# Patient Record
Sex: Female | Born: 1985 | Race: Black or African American | Hispanic: No | Marital: Single | Smoking: Current some day smoker
Health system: Southern US, Community
[De-identification: ages and names within clinical notes are randomized; demographics above are authoritative.]

## PROBLEM LIST (undated history)

## (undated) ENCOUNTER — Inpatient Hospital Stay (HOSPITAL_COMMUNITY): Payer: Self-pay

## (undated) DIAGNOSIS — M419 Scoliosis, unspecified: Secondary | ICD-10-CM

## (undated) HISTORY — PX: NO PAST SURGERIES: SHX2092

---

## 2010-01-20 ENCOUNTER — Inpatient Hospital Stay (HOSPITAL_COMMUNITY)
Admission: AD | Admit: 2010-01-20 | Discharge: 2010-01-21 | Payer: Self-pay | Source: Home / Self Care | Attending: Obstetrics & Gynecology | Admitting: Obstetrics & Gynecology

## 2010-01-28 LAB — URINALYSIS, ROUTINE W REFLEX MICROSCOPIC
Ketones, ur: 15 mg/dL — AB
Leukocytes, UA: NEGATIVE
Nitrite: NEGATIVE
Protein, ur: 30 mg/dL — AB
Specific Gravity, Urine: >1.03 — ABNORMAL HIGH (ref 1.005–1.030)
Urine Glucose, Fasting: NEGATIVE mg/dL
Urobilinogen, UA: 1 mg/dL (ref 0.0–1.0)
pH: 5.5 (ref 5.0–8.0)

## 2010-01-28 LAB — WET PREP, GENITAL
Trich, Wet Prep: NONE SEEN
Yeast Wet Prep HPF POC: NONE SEEN

## 2010-01-28 LAB — POCT PREGNANCY, URINE: Preg Test, Ur: NEGATIVE

## 2010-01-28 LAB — URINE MICROSCOPIC-ADD ON

## 2010-01-28 LAB — COMPREHENSIVE METABOLIC PANEL
ALT: 24 U/L (ref 0–35)
AST: 31 U/L (ref 0–37)
Albumin: 3.2 g/dL — ABNORMAL LOW (ref 3.5–5.2)
Alkaline Phosphatase: 42 U/L (ref 39–117)
BUN: 9 mg/dL (ref 6–23)
CO2: 24 mEq/L (ref 19–32)
Calcium: 8.5 mg/dL (ref 8.4–10.5)
Chloride: 104 mEq/L (ref 96–112)
Creatinine, Ser: 0.83 mg/dL (ref 0.4–1.2)
GFR calc Af Amer: >60 mL/min (ref >60)
GFR calc non Af Amer: >60 mL/min (ref >60)
Glucose, Bld: 159 mg/dL — ABNORMAL HIGH (ref 70–99)
Potassium: 3.2 mEq/L — ABNORMAL LOW (ref 3.5–5.1)
Sodium: 135 mEq/L (ref 135–145)
Total Bilirubin: 2.1 mg/dL — ABNORMAL HIGH (ref 0.3–1.2)
Total Protein: 6.5 g/dL (ref 6.0–8.3)

## 2010-01-28 LAB — CBC
HCT: 33.5 % — ABNORMAL LOW (ref 36.0–46.0)
Hemoglobin: 11.4 g/dL — ABNORMAL LOW (ref 12.0–15.0)
MCH: 32.6 pg (ref 26.0–34.0)
MCHC: 34 g/dL (ref 30.0–36.0)
MCV: 95.7 fL (ref 78.0–100.0)
Platelets: 226 10*3/uL (ref 150–400)
RBC: 3.5 MIL/uL — ABNORMAL LOW (ref 3.87–5.11)
RDW: 13.3 % (ref 11.5–15.5)
WBC: 20.8 10*3/uL — ABNORMAL HIGH (ref 4.0–10.5)

## 2010-01-28 LAB — GC/CHLAMYDIA PROBE AMP, GENITAL
Chlamydia, DNA Probe: NEGATIVE
GC Probe Amp, Genital: POSITIVE — AB

## 2010-01-28 LAB — HIV ANTIBODY (ROUTINE TESTING W REFLEX): HIV: NONREACTIVE

## 2010-01-28 LAB — RPR: RPR Ser Ql: NONREACTIVE

## 2010-02-13 ENCOUNTER — Ambulatory Visit: Admit: 2010-02-13 | Payer: Self-pay | Admitting: Obstetrics & Gynecology

## 2010-02-13 ENCOUNTER — Encounter: Payer: Self-pay | Admitting: Family Medicine

## 2010-09-13 ENCOUNTER — Other Ambulatory Visit (HOSPITAL_COMMUNITY): Payer: Self-pay

## 2010-09-13 ENCOUNTER — Emergency Department (HOSPITAL_COMMUNITY): Payer: Self-pay

## 2010-09-13 ENCOUNTER — Emergency Department (HOSPITAL_COMMUNITY)
Admission: EM | Admit: 2010-09-13 | Discharge: 2010-09-13 | Disposition: A | Discharge status: Home / Self Care | Payer: Self-pay | Attending: Emergency Medicine | Admitting: Emergency Medicine

## 2010-09-13 DIAGNOSIS — R112 Nausea with vomiting, unspecified: Secondary | ICD-10-CM | POA: Insufficient documentation

## 2010-09-13 DIAGNOSIS — J45909 Unspecified asthma, uncomplicated: Secondary | ICD-10-CM | POA: Insufficient documentation

## 2010-09-13 DIAGNOSIS — O2390 Unspecified genitourinary tract infection in pregnancy, unspecified trimester: Secondary | ICD-10-CM | POA: Insufficient documentation

## 2010-09-13 DIAGNOSIS — N949 Unspecified condition associated with female genital organs and menstrual cycle: Secondary | ICD-10-CM | POA: Insufficient documentation

## 2010-09-13 DIAGNOSIS — N739 Female pelvic inflammatory disease, unspecified: Secondary | ICD-10-CM | POA: Insufficient documentation

## 2010-09-13 LAB — POCT I-STAT, CHEM 8
BUN: 11 mg/dL (ref 6–23)
Calcium, Ion: 1.13 mmol/L (ref 1.12–1.32)
Chloride: 106 mEq/L (ref 96–112)
Creatinine, Ser: 0.6 mg/dL (ref 0.50–1.10)
Glucose, Bld: 74 mg/dL (ref 70–99)
HCT: 42 % (ref 36.0–46.0)
Hemoglobin: 14.3 g/dL (ref 12.0–15.0)
Potassium: 3.7 mEq/L (ref 3.5–5.1)
Sodium: 135 mEq/L (ref 135–145)
TCO2: 22 mmol/L (ref 0–100)

## 2010-09-13 LAB — URINALYSIS, ROUTINE W REFLEX MICROSCOPIC
Bilirubin Urine: NEGATIVE
Glucose, UA: NEGATIVE mg/dL
Hgb urine dipstick: NEGATIVE
Ketones, ur: 15 mg/dL — AB
Leukocytes, UA: NEGATIVE
Nitrite: NEGATIVE
Protein, ur: NEGATIVE mg/dL
Specific Gravity, Urine: 1.023 (ref 1.005–1.030)
Urobilinogen, UA: 1 mg/dL (ref 0.0–1.0)
pH: 6.5 (ref 5.0–8.0)

## 2010-09-13 LAB — HCG, QUANTITATIVE, PREGNANCY: hCG, Beta Chain, Quant, S: 24330 m[IU]/mL — ABNORMAL HIGH (ref <5)

## 2010-09-13 LAB — ABO/RH: ABO/RH(D): A POS

## 2010-09-13 LAB — POCT PREGNANCY, URINE: Preg Test, Ur: POSITIVE

## 2010-09-14 LAB — GC/CHLAMYDIA PROBE AMP, GENITAL
Chlamydia, DNA Probe: NEGATIVE
GC Probe Amp, Genital: NEGATIVE

## 2010-12-29 ENCOUNTER — Inpatient Hospital Stay (HOSPITAL_COMMUNITY)
Admission: AD | Admit: 2010-12-29 | Discharge: 2010-12-29 | Disposition: A | Discharge status: Home / Self Care | Payer: Self-pay | Source: Ambulatory Visit | Attending: Family Medicine | Admitting: Family Medicine

## 2010-12-29 ENCOUNTER — Inpatient Hospital Stay (HOSPITAL_COMMUNITY): Payer: Self-pay

## 2010-12-29 ENCOUNTER — Encounter (HOSPITAL_COMMUNITY): Payer: Self-pay | Admitting: *Deleted

## 2010-12-29 DIAGNOSIS — O0930 Supervision of pregnancy with insufficient antenatal care, unspecified trimester: Secondary | ICD-10-CM | POA: Insufficient documentation

## 2010-12-29 DIAGNOSIS — R109 Unspecified abdominal pain: Secondary | ICD-10-CM | POA: Insufficient documentation

## 2010-12-29 DIAGNOSIS — O99891 Other specified diseases and conditions complicating pregnancy: Secondary | ICD-10-CM | POA: Insufficient documentation

## 2010-12-29 HISTORY — DX: Scoliosis, unspecified: M41.9

## 2010-12-29 LAB — URINALYSIS, ROUTINE W REFLEX MICROSCOPIC
Bilirubin Urine: NEGATIVE
Glucose, UA: NEGATIVE mg/dL
Hgb urine dipstick: NEGATIVE
Ketones, ur: NEGATIVE mg/dL
Nitrite: NEGATIVE
Protein, ur: NEGATIVE mg/dL
Specific Gravity, Urine: 1.015 (ref 1.005–1.030)
Urobilinogen, UA: 0.2 mg/dL (ref 0.0–1.0)
pH: 6 (ref 5.0–8.0)

## 2010-12-29 LAB — URINE MICROSCOPIC-ADD ON

## 2010-12-29 LAB — CBC
HCT: 32.5 % — ABNORMAL LOW (ref 36.0–46.0)
MCHC: 33.8 g/dL (ref 30.0–36.0)
RDW: 13.1 % (ref 11.5–15.5)

## 2010-12-29 LAB — DIFFERENTIAL
Basophils Absolute: 0 10*3/uL (ref 0.0–0.1)
Basophils Relative: 0 % (ref 0–1)
Eosinophils Relative: 0 % (ref 0–5)
Monocytes Absolute: 0.8 10*3/uL (ref 0.1–1.0)
Neutro Abs: 6.4 10*3/uL (ref 1.7–7.7)

## 2010-12-29 LAB — RAPID URINE DRUG SCREEN, HOSP PERFORMED
Amphetamines: NOT DETECTED
Benzodiazepines: NOT DETECTED
Cocaine: NOT DETECTED
Opiates: NOT DETECTED

## 2010-12-29 LAB — WET PREP, GENITAL

## 2010-12-29 LAB — RPR: RPR Ser Ql: NONREACTIVE

## 2010-12-29 MED ORDER — PRENATAL RX 60-1 MG PO TABS: 1.0000 | ORAL_TABLET | Freq: Every day | ORAL | Status: DC

## 2010-12-29 NOTE — ED Provider Notes (Signed)
History     Chief Complaint  Patient presents with  . Abdominal Pain   HPI Comments: 25 yo [id number] at [redacted]w[redacted]d who presents with stomach pain.  She states that the pain started 2-3 days ago.  They occur "off and on" q30-40 minutes.   Denies loss of fluid, no vaginal bleeding.   No vaginal itching or increased discharge.    No PNC, waiting on medicaid.  Would like a proof of pregnancy letter.   Intermittent HA, relieved by tylenol and rest.     Denies family hx of genetic d/o.   G1 - born "2 weeks early", approximately 6lbs G2 - born "7 weeks early" - 5l;bs, spent 2 weeks in NICU b/c of breathing problems.  -previously deliveries and prenatal care in [place].   No history of HTN or DM.   Denies EtOH use.  Quit smoking 3 months ago.  Occasional marajuana use.   Past Medical History  Diagnosis Date  . Asthma   . Scoliosis     Past Surgical History  Procedure Date  . No past surgeries     Family History  Problem Relation Age of Onset  . Diabetes Maternal Aunt     History  Substance Use Topics  . Smoking status: Former Smoker    Quit date: 08/29/2010  . Smokeless tobacco: Not on file  . Alcohol Use: No    Allergies: No Known Allergies  Prescriptions prior to admission  Medication Sig Dispense Refill  . acetaminophen (TYLENOL) 325 MG tablet Take 650 mg by mouth every 6 (six) hours as needed. pain         Review of Systems  Constitutional: Negative for fever and chills.  HENT: Negative for congestion.   Eyes: Negative for blurred vision.  Respiratory: Negative for cough.   Cardiovascular: Negative for chest pain and palpitations.  Gastrointestinal: Positive for abdominal pain. Negative for heartburn, nausea and vomiting.  Genitourinary: Negative for dysuria, urgency and frequency.  Skin: Negative for rash.  Neurological: Positive for headaches. Negative for dizziness.  Psychiatric/Behavioral: Negative for depression.   Physical Exam   Blood pressure  118/62, pulse 91, temperature 98.4 F (36.9 C), temperature source Oral, resp. rate 18, height 5\' 7"  (1.702 m), weight 71.305 kg (157 lb 3.2 oz).  Physical Exam  Constitutional: She is oriented to person, place, and time. She appears well-developed and well-nourished.  HENT:  Head: Normocephalic and atraumatic.  Eyes: EOM are normal.  Neck: Normal range of motion.  Cardiovascular: Normal rate and regular rhythm.  Exam reveals no gallop and no friction rub.   No murmur heard. Respiratory: Effort normal and breath sounds normal. No respiratory distress. She has no wheezes.  GI: Soft. She exhibits no distension.       Gravid  Genitourinary: Vagina normal. There is no rash, tenderness or lesion on the right labia. There is no rash, tenderness or lesion on the left labia. Cervix exhibits discharge.  Musculoskeletal: She exhibits no edema.  Neurological: She is alert and oriented to person, place, and time.  Skin: Skin is warm and dry.  Psychiatric: She has a normal mood and affect.    MAU Course  Procedures -[place] 14+ week  Assessment and Plan  25yo [id number] at [redacted]w[redacted]d who presents with lower abdominal cramping.  -No PNC -Feeling baby move -in process of applying for medicaid -otherwise healthy, but with history of LBW babies and preterm delivery, with baby spending 2 weeks in NICU -ROI sent for records in [place] -pt  will f/u in low risk clinic  Discussed with Deirdre Boris Lown, Skylin Kennerson 12/29/2010, 6:19 PM

## 2010-12-29 NOTE — Progress Notes (Signed)
Pt reports having lower abd pain and pressure on and off for 2-3 days. No prenatalcare started. Hx of PT delivery 33weeks

## 2010-12-30 ENCOUNTER — Encounter: Payer: Self-pay | Admitting: Obstetrics & Gynecology

## 2010-12-30 LAB — GC/CHLAMYDIA PROBE AMP, GENITAL: Chlamydia, DNA Probe: NEGATIVE

## 2011-01-08 ENCOUNTER — Encounter: Payer: Self-pay | Admitting: Family

## 2011-01-08 ENCOUNTER — Ambulatory Visit (INDEPENDENT_AMBULATORY_CARE_PROVIDER_SITE_OTHER): Payer: Self-pay | Admitting: Family

## 2011-01-08 VITALS — BP 104/68 | Temp 98.5°F | Wt 153.6 lb

## 2011-01-08 DIAGNOSIS — O09219 Supervision of pregnancy with history of pre-term labor, unspecified trimester: Secondary | ICD-10-CM | POA: Insufficient documentation

## 2011-01-08 DIAGNOSIS — Z23 Encounter for immunization: Secondary | ICD-10-CM

## 2011-01-08 DIAGNOSIS — Z8751 Personal history of pre-term labor: Secondary | ICD-10-CM

## 2011-01-08 DIAGNOSIS — O0990 Supervision of high risk pregnancy, unspecified, unspecified trimester: Secondary | ICD-10-CM

## 2011-01-08 LAB — POCT URINALYSIS DIP (DEVICE)
Bilirubin Urine: NEGATIVE
Glucose, UA: NEGATIVE mg/dL
Hgb urine dipstick: NEGATIVE
Ketones, ur: NEGATIVE mg/dL
Specific Gravity, Urine: >=1.03 (ref 1.005–1.030)

## 2011-01-08 MED ORDER — TETANUS-DIPHTH-ACELL PERTUSSIS 5-2.5-18.5 LF-MCG/0.5 IM SUSP: 0.5000 mL | Freq: Once | INTRAMUSCULAR | Status: DC

## 2011-01-08 MED ORDER — INFLUENZA VIRUS VACC SPLIT PF IM SUSP: 0.5000 mL | INTRAMUSCULAR | Status: AC

## 2011-01-08 NOTE — Progress Notes (Signed)
Pain/pressure- side of back.  Pulse-76

## 2011-01-08 NOTE — Progress Notes (Signed)
Pt here, referred from MAU as a new ob; OB labs completed in [facility], as well as an ultrasound;  Reports irregular contractions, cervix 1 cm, same as in MAU.  1 hr GCT and pap completed. Preterm labor precautions given.      Exam    Uterine Size: size equals dates  Pelvic Exam:    Perineum: No Hemorrhoids, Normal Perineum   Vulva: normal   Vagina:  normal mucosa, normal discharge, no palpable nodules   pH: Not done   Cervix: no bleeding following Pap, no cervical motion tenderness and no lesions   Adnexa: normal adnexa and no mass, fullness, tenderness   Bony Pelvis: Adequate  System: Breast:  No nipple retraction or dimpling, No nipple discharge or bleeding, No axillary or supraclavicular adenopathy, Normal to palpation without dominant masses   Skin: normal coloration and turgor, no rashes    Neurologic: negative   Extremities: normal strength, tone, and muscle mass   HEENT neck supple with midline trachea and thyroid without masses   Mouth/Teeth mucous membranes moist, pharynx normal without lesions   Neck supple and no masses   Cardiovascular: regular rate and rhythm, no murmurs or gallops   Respiratory:  appears well, vitals normal, no respiratory distress, acyanotic, normal RR, neck free of mass or lymphadenopathy, chest clear, no wheezing, crepitations, rhonchi, normal symmetric air entry   Abdomen: soft, non-tender; bowel sounds normal; no masses,  no organomegaly   Urinary: urethral meatus normal

## 2011-01-10 LAB — CULTURE, OB URINE: Colony Count: 2000

## 2011-01-14 NOTE — L&D Delivery Note (Cosign Needed)
Delivery Note At 7:34 AM a viable female was delivered via Vaginal, Spontaneous Delivery (Presentation: Right Occiput Anterior).  APGAR: 8, 8; weight .   Placenta status: Intact, Spontaneous Pathology.  Cord: 3 vessels with the following complications: None.  Cord pH: 7.29 normal range for preterm baby.    Anesthesia: Epidural  Episiotomy: None Lacerations: None Suture Repair: N/A Est. Blood Loss (mL): 250  Mom to postpartum.  Baby to NICU.   Candace Booker. MD PGY-1  01/21/2011, 8:47 AM

## 2011-01-15 ENCOUNTER — Ambulatory Visit (INDEPENDENT_AMBULATORY_CARE_PROVIDER_SITE_OTHER): Payer: Self-pay | Admitting: Family

## 2011-01-15 ENCOUNTER — Other Ambulatory Visit: Payer: Self-pay | Admitting: Family

## 2011-01-15 ENCOUNTER — Encounter: Payer: Self-pay | Admitting: *Deleted

## 2011-01-15 DIAGNOSIS — O09219 Supervision of pregnancy with history of pre-term labor, unspecified trimester: Secondary | ICD-10-CM

## 2011-01-15 DIAGNOSIS — R87619 Unspecified abnormal cytological findings in specimens from cervix uteri: Secondary | ICD-10-CM

## 2011-01-15 LAB — POCT URINALYSIS DIP (DEVICE)
Bilirubin Urine: NEGATIVE
Ketones, ur: NEGATIVE mg/dL

## 2011-01-15 NOTE — Progress Notes (Signed)
Pelvic pressure. No vaginal discharge. Pulse 77. Pt has 4 lb weight loss since last visit.

## 2011-01-15 NOTE — Progress Notes (Signed)
Reviewed pap smear results (LSIL w possible HGIL) need colpo after delivery;  4 lb weight loss since last visit; if continued loss at next visit plan to see nutritional ist.

## 2011-01-20 ENCOUNTER — Inpatient Hospital Stay (HOSPITAL_COMMUNITY)
Admission: AD | Admit: 2011-01-20 | Discharge: 2011-01-23 | DRG: 775 | Disposition: A | Discharge status: Home / Self Care | Payer: Medicaid Other | Source: Ambulatory Visit | Attending: Obstetrics & Gynecology | Admitting: Obstetrics & Gynecology

## 2011-01-20 ENCOUNTER — Encounter (HOSPITAL_COMMUNITY): Payer: Self-pay | Admitting: *Deleted

## 2011-01-20 ENCOUNTER — Inpatient Hospital Stay (HOSPITAL_COMMUNITY): Payer: Medicaid Other | Admitting: Anesthesiology

## 2011-01-20 ENCOUNTER — Encounter (HOSPITAL_COMMUNITY): Payer: Self-pay | Admitting: Anesthesiology

## 2011-01-20 DIAGNOSIS — O09219 Supervision of pregnancy with history of pre-term labor, unspecified trimester: Secondary | ICD-10-CM

## 2011-01-20 DIAGNOSIS — Z8751 Personal history of pre-term labor: Secondary | ICD-10-CM

## 2011-01-20 DIAGNOSIS — R87619 Unspecified abnormal cytological findings in specimens from cervix uteri: Secondary | ICD-10-CM

## 2011-01-20 LAB — CBC
HCT: 32.9 % — ABNORMAL LOW (ref 36.0–46.0)
Hemoglobin: 11.2 g/dL — ABNORMAL LOW (ref 12.0–15.0)
MCV: 96.5 fL (ref 78.0–100.0)
RDW: 13.1 % (ref 11.5–15.5)
WBC: 9.6 10*3/uL (ref 4.0–10.5)

## 2011-01-20 LAB — URINALYSIS, ROUTINE W REFLEX MICROSCOPIC
Glucose, UA: NEGATIVE mg/dL
Ketones, ur: NEGATIVE mg/dL
Protein, ur: NEGATIVE mg/dL
pH: 6.5 (ref 5.0–8.0)

## 2011-01-20 LAB — CULTURE, BETA STREP (GROUP B ONLY)

## 2011-01-20 LAB — WET PREP, GENITAL: Yeast Wet Prep HPF POC: NONE SEEN

## 2011-01-20 LAB — URINE MICROSCOPIC-ADD ON

## 2011-01-20 MED ORDER — FENTANYL 2.5 MCG/ML BUPIVACAINE 1/10 % EPIDURAL INFUSION (WH - ANES)
14.0000 mL/h | INTRAMUSCULAR | Status: DC
  Administered 2011-01-20 – 2011-01-21 (×2): 14 mL/h via EPIDURAL
  Filled 2011-01-20 (×2): qty 60

## 2011-01-20 MED ORDER — LACTATED RINGERS IV SOLN
INTRAVENOUS | Status: DC
  Administered 2011-01-20: 100 mL/h via INTRAVENOUS
  Administered 2011-01-20 – 2011-01-21 (×2): via INTRAVENOUS

## 2011-01-20 MED ORDER — PENICILLIN G POTASSIUM 5000000 UNITS IJ SOLR
5.0000 10*6.[IU] | Freq: Once | INTRAVENOUS | Status: AC
  Administered 2011-01-20: 5 10*6.[IU] via INTRAVENOUS
  Filled 2011-01-20: qty 5

## 2011-01-20 MED ORDER — MAGNESIUM SULFATE BOLUS VIA INFUSION
4.0000 g | Freq: Once | INTRAVENOUS | Status: DC
  Administered 2011-01-20: 4 g via INTRAVENOUS
  Filled 2011-01-20: qty 500

## 2011-01-20 MED ORDER — DIPHENHYDRAMINE HCL 50 MG/ML IJ SOLN: 12.5000 mg | INTRAMUSCULAR | Status: DC | PRN

## 2011-01-20 MED ORDER — PHENYLEPHRINE 40 MCG/ML (10ML) SYRINGE FOR IV PUSH (FOR BLOOD PRESSURE SUPPORT): 80.0000 ug | PREFILLED_SYRINGE | INTRAVENOUS | Status: DC | PRN

## 2011-01-20 MED ORDER — BETAMETHASONE SOD PHOS & ACET 6 (3-3) MG/ML IJ SUSP
12.0000 mg | Freq: Once | INTRAMUSCULAR | Status: DC
  Filled 2011-01-20: qty 2

## 2011-01-20 MED ORDER — OXYTOCIN 20 UNITS IN LACTATED RINGERS INFUSION - SIMPLE
125.0000 mL/h | Freq: Once | INTRAVENOUS | Status: AC
  Administered 2011-01-21: 150 mL/h via INTRAVENOUS

## 2011-01-20 MED ORDER — PHENYLEPHRINE 40 MCG/ML (10ML) SYRINGE FOR IV PUSH (FOR BLOOD PRESSURE SUPPORT)
80.0000 ug | PREFILLED_SYRINGE | INTRAVENOUS | Status: DC | PRN
  Filled 2011-01-20: qty 5

## 2011-01-20 MED ORDER — PENICILLIN G POTASSIUM 5000000 UNITS IJ SOLR
2.5000 10*6.[IU] | INTRAVENOUS | Status: DC
  Administered 2011-01-20 – 2011-01-21 (×4): 2.5 10*6.[IU] via INTRAVENOUS
  Filled 2011-01-20 (×7): qty 2.5

## 2011-01-20 MED ORDER — MAGNESIUM SULFATE 40 G IN LACTATED RINGERS - SIMPLE
2.0000 g/h | INTRAVENOUS | Status: DC
  Administered 2011-01-20: 2 g/h via INTRAVENOUS
  Filled 2011-01-20: qty 500

## 2011-01-20 MED ORDER — LACTATED RINGERS IV SOLN: 500.0000 mL | INTRAVENOUS | Status: DC | PRN

## 2011-01-20 MED ORDER — OXYTOCIN BOLUS FROM INFUSION
500.0000 mL | Freq: Once | INTRAVENOUS | Status: DC
  Filled 2011-01-20: qty 1000
  Filled 2011-01-20: qty 500

## 2011-01-20 MED ORDER — LACTATED RINGERS IV SOLN
500.0000 mL | Freq: Once | INTRAVENOUS | Status: AC
  Administered 2011-01-20: 500 mL via INTRAVENOUS

## 2011-01-20 MED ORDER — OXYCODONE-ACETAMINOPHEN 5-325 MG PO TABS: 2.0000 | ORAL_TABLET | ORAL | Status: DC | PRN

## 2011-01-20 MED ORDER — NALBUPHINE SYRINGE 5 MG/0.5 ML
5.0000 mg | INJECTION | INTRAMUSCULAR | Status: DC | PRN
  Administered 2011-01-20 (×2): 5 mg via INTRAVENOUS
  Filled 2011-01-20 (×2): qty 0.5

## 2011-01-20 MED ORDER — FLEET ENEMA 7-19 GM/118ML RE ENEM: 1.0000 | ENEMA | RECTAL | Status: DC | PRN

## 2011-01-20 MED ORDER — ONDANSETRON HCL 4 MG/2ML IJ SOLN: 4.0000 mg | Freq: Four times a day (QID) | INTRAMUSCULAR | Status: DC | PRN

## 2011-01-20 MED ORDER — EPHEDRINE 5 MG/ML INJ: 10.0000 mg | INTRAVENOUS | Status: DC | PRN

## 2011-01-20 MED ORDER — LIDOCAINE HCL 1.5 % IJ SOLN
INTRAMUSCULAR | Status: DC | PRN
  Administered 2011-01-20: 4 mL via INTRADERMAL
  Administered 2011-01-20: 3 mL via INTRADERMAL
  Administered 2011-01-20: 4 mL via INTRADERMAL

## 2011-01-20 MED ORDER — CITRIC ACID-SODIUM CITRATE 334-500 MG/5ML PO SOLN: 30.0000 mL | ORAL | Status: DC | PRN

## 2011-01-20 MED ORDER — ACETAMINOPHEN 325 MG PO TABS: 650.0000 mg | ORAL_TABLET | ORAL | Status: DC | PRN

## 2011-01-20 MED ORDER — LIDOCAINE HCL (PF) 1 % IJ SOLN
30.0000 mL | INTRAMUSCULAR | Status: DC | PRN
  Filled 2011-01-20: qty 30

## 2011-01-20 MED ORDER — IBUPROFEN 600 MG PO TABS
600.0000 mg | ORAL_TABLET | Freq: Four times a day (QID) | ORAL | Status: DC | PRN
  Administered 2011-01-21: 600 mg via ORAL
  Filled 2011-01-20: qty 1

## 2011-01-20 MED ORDER — BETAMETHASONE SOD PHOS & ACET 6 (3-3) MG/ML IJ SUSP
12.0000 mg | INTRAMUSCULAR | Status: DC
  Administered 2011-01-20: 12 mg via INTRAMUSCULAR
  Filled 2011-01-20 (×2): qty 2

## 2011-01-20 MED ORDER — EPHEDRINE 5 MG/ML INJ
10.0000 mg | INTRAVENOUS | Status: DC | PRN
  Filled 2011-01-20: qty 4

## 2011-01-20 NOTE — H&P (Signed)
Candace Booker is a 26 y.o. female presenting for painful contractions and vaginal bleeding. The vaginal bleeding started yesterday and it was bright red, and was not associated to pain. Today she began with painful contractions. Pt has history of late prenatal care. She also has a history of her prior baby born at 64 weeks and both of her children being born with low weight. Afebrile. Has prior U/S in 12/30/2010 with placenta anterior and above cervical OS. Also has pap smear  LSIL with possible HGIL that needs colposcopy after delivery. Maternal Medical History:  Reason for admission: Reason for Admission:   nausea  OB History    Grav Para Term Preterm Abortions TAB SAB Ect Mult Living   3 2 1 1  0 0 0 0 0 2     Past Medical History  Diagnosis Date  . Asthma   . Scoliosis    Past Surgical History  Procedure Date  . No past surgeries    Family History: family history includes Diabetes in her maternal aunt. Social History:  reports that she quit smoking about 4 months ago. She does not have any smokeless tobacco history on file. She reports that she uses illicit drugs (Marijuana). She reports that she does not drink alcohol.  Review of Systems  Constitutional: Positive for weight loss. Negative for fever and chills.       4 lb weight loss reported since lat time she was seen in office.  Eyes: Negative for blurred vision and double vision.  Respiratory: Negative.   Cardiovascular: Negative for chest pain.  Gastrointestinal: Negative for nausea and vomiting.  Genitourinary: Negative.   Musculoskeletal: Positive for back pain.  Skin: Negative.   Neurological: Negative.  Negative for headaches.  Endo/Heme/Allergies: Negative.     Dilation: 4 Effacement (%): 90 Station: -1 Exam by:: Candace Booker Blood pressure 109/62, pulse 74, temperature 98.8 F (37.1 C), temperature source Oral, resp. rate 20, height 5\' 6"  (1.676 m), weight 68.493 kg (151 lb). Exam Physical Exam    Constitutional: She appears distressed.       Very distressed with painful contractions.  HENT:  Mouth/Throat: Oropharynx is clear and moist.  Eyes: Conjunctivae are normal.  Neck: Neck supple.  Cardiovascular: Normal rate, regular rhythm and normal heart sounds.   Respiratory: Effort normal and breath sounds normal. No respiratory distress.  GI: Soft. There is no rebound and no guarding.       Normal abdominal exam of a 33 week pregnancy.  Genitourinary:       Speculum and vaginal exam: Bleeding from cervical changes. No bleeding from OS. Cervix with 4-5 cm and 90 % effacement. Station -1. Intact membranes.   Musculoskeletal: She exhibits no edema.    Prenatal labs: ABO, Rh: --/--/A POS (12/16 1725) Antibody: NEG (12/16 1716) Rubella: 103.2 (12/16 1713) RPR: NON REACTIVE (12/16 1713)  HBsAg: NEGATIVE (12/16 1713)  HIV: NON REACTIVE (12/16 1713)  GBS:   unknown.  GTT 1h: 95   Assessment/Plan: Assessment: 1. Labor:Preterm. 2. Fetal Wellbeing: Category 1  3. Pain Control: may have epidural 4. GBS: unknown. 5. 33.3 week IUP  Plan:  1. Admit to BS. 2. Routine L&D orders 3. Magnesium Sulfate bolus and scheduled 4. Penicillin  5. Betamethasone x 2 doses. 6. Analgesia/anesthesia PRN  7. GC, Chlamydia, wet prep performed in MAU.    Candace Booker. MD PGY-1  01/20/2011, 1:36 PM

## 2011-01-20 NOTE — H&P (Signed)
Attestation of Attending Supervision of Resident: Evaluation and management procedures were performed by the [facility] Resident under my supervision.  I have reviewed the resident's note, chart reviewed and agree with management and plan.  Jaynie Collins, M.D. 01/20/2011 2:38 PM

## 2011-01-20 NOTE — Anesthesia Procedure Notes (Signed)
Epidural Patient location during procedure: OB Start time: 01/20/2011 11:02 PM Reason for block: procedure for pain  Staffing Performed by: anesthesiologist   Preanesthetic Checklist Completed: patient identified, site marked, surgical consent, pre-op evaluation, timeout performed, IV checked, risks and benefits discussed and monitors and equipment checked  Epidural Patient position: sitting Prep: site prepped and draped and DuraPrep Patient monitoring: continuous pulse ox and blood pressure Approach: midline Injection technique: LOR air  Needle:  Needle type: Tuohy  Needle gauge: 17 G Needle length: 9 cm Catheter type: closed end flexible Catheter size: 19 Gauge Test dose: negative  Assessment Events: blood not aspirated, injection not painful, no injection resistance, negative IV test and no paresthesia  Additional Notes Discussed risk of headache, infection, bleeding, nerve injury and failed or incomplete block.  Patient voices understanding and wishes to proceed.

## 2011-01-20 NOTE — Progress Notes (Signed)
Pain started yesterday.  Gotten closer today.  Bleeding yesterday and today.  Hx of PTL/ delivery.

## 2011-01-20 NOTE — Progress Notes (Signed)
Pt in c/o ucs q5 minutes, started yesterday, worse today.  Reports small amount of light vaginal bleeding.  Denies any other discharge or leaking of fluid.  +FM.

## 2011-01-20 NOTE — Consult Note (Signed)
Asked by Dr Adrian Blackwater to speak to Candace Booker regarding preterm outcome. She is 33 3/[redacted] wks pregnant in labor, late [facility], prenatal labs are neg with unknown GBS. She has had a 4 lb weight loss in the past few weeks. Per Candace Booker, she has been vomitting. FUS in Dec prior to weight loss shows appropriate fetal growth. She is on magnesium sulfate, Pen G,  and received a dose of betamethasone. She delivered a 29 weeker previously who is now 25 y.o. so she is familiar with the experience in the NICU. I discussed resuscitation, RDS, various respiratory support, and criteria for discharge from the hospital. She is not interested in breastfeeding. Thank you for this consult.  Emad Brechtel Q

## 2011-01-20 NOTE — Progress Notes (Signed)
Subjective: Contractions spaced out.  Still feel moderate pain with contraction  Objective: BP 112/65  Pulse 76  Temp(Src) 97.4 F (36.3 C) (Oral)  Resp 18  Ht 5\' 6"  (1.676 m)  Wt 68.493 kg (151 lb)  BMI 24.37 kg/m2   Total I/O In: -  Out: 100 [Urine:100]  FHT:  FHR: 125 bpm, variability: moderate,  accelerations:  Present,  decelerations:  Absent UC:   regular, every 8 minutes SVE:   Dilation: 6 Effacement (%): 90 Station: -1 Exam by:: Manus Rudd, MD  Labs: Lab Results  Component Value Date   WBC 9.6 01/20/2011   HGB 11.2* 01/20/2011   HCT 32.9* 01/20/2011   MCV 96.5 01/20/2011   PLT 230 01/20/2011    Assessment / Plan: spontaneous labor - on magnesium, pcn.  BMZ x 1 dose given.  Will continue tocolytics in attempt to arrest labor.  Oswaldo Cueto JEHIEL 01/20/2011, 3:46 PM

## 2011-01-20 NOTE — Anesthesia Preprocedure Evaluation (Addendum)
Anesthesia Evaluation  Patient identified by MRN, date of birth, ID band Patient awake    Reviewed: Allergy & Precautions, H&P , NPO status , Patient's Chart, lab work & pertinent test results, reviewed documented beta blocker date and time   History of Anesthesia Complications Negative for: history of anesthetic complications  Airway Mallampati: III TM Distance: >3 FB Neck ROM: full    Dental  (+) Teeth Intact   Pulmonary asthma (no inhaler use in years) , former smoker (quit 12/12) clear to auscultation        Cardiovascular neg cardio ROS regular Normal    Neuro/Psych Negative Neurological ROS  Negative Psych ROS   GI/Hepatic negative GI ROS, Neg liver ROS,   Endo/Other  Negative Endocrine ROS  Renal/GU negative Renal ROS  Genitourinary negative   Musculoskeletal   Abdominal   Peds  Hematology negative hematology ROS (+)   Anesthesia Other Findings   Reproductive/Obstetrics (+) Pregnancy (no prenatal care)                          Anesthesia Physical Anesthesia Plan  ASA: II  Anesthesia Plan: Epidural   Post-op Pain Management:    Induction:   Airway Management Planned:   Additional Equipment:   Intra-op Plan:   Post-operative Plan:   Informed Consent: I have reviewed the patients History and Physical, chart, labs and discussed the procedure including the risks, benefits and alternatives for the proposed anesthesia with the patient or authorized representative who has indicated his/her understanding and acceptance.     Plan Discussed with:   Anesthesia Plan Comments:         Anesthesia Quick Evaluation

## 2011-01-21 ENCOUNTER — Encounter (HOSPITAL_COMMUNITY): Payer: Self-pay | Admitting: *Deleted

## 2011-01-21 ENCOUNTER — Other Ambulatory Visit: Payer: Self-pay | Admitting: Obstetrics & Gynecology

## 2011-01-21 MED ORDER — TETANUS-DIPHTH-ACELL PERTUSSIS 5-2.5-18.5 LF-MCG/0.5 IM SUSP
0.5000 mL | Freq: Once | INTRAMUSCULAR | Status: DC
  Filled 2011-01-21: qty 0.5

## 2011-01-21 MED ORDER — ONDANSETRON HCL 4 MG/2ML IJ SOLN: 4.0000 mg | INTRAMUSCULAR | Status: DC | PRN

## 2011-01-21 MED ORDER — BENZOCAINE-MENTHOL 20-0.5 % EX AERO: 1.0000 "application " | INHALATION_SPRAY | CUTANEOUS | Status: DC | PRN

## 2011-01-21 MED ORDER — SIMETHICONE 80 MG PO CHEW: 80.0000 mg | CHEWABLE_TABLET | ORAL | Status: DC | PRN

## 2011-01-21 MED ORDER — WITCH HAZEL-GLYCERIN EX PADS: 1.0000 "application " | MEDICATED_PAD | CUTANEOUS | Status: DC | PRN

## 2011-01-21 MED ORDER — OXYCODONE-ACETAMINOPHEN 5-325 MG PO TABS
1.0000 | ORAL_TABLET | ORAL | Status: DC | PRN
  Administered 2011-01-21 – 2011-01-23 (×2): 1 via ORAL
  Filled 2011-01-21 (×2): qty 1

## 2011-01-21 MED ORDER — DIPHENHYDRAMINE HCL 25 MG PO CAPS: 25.0000 mg | ORAL_CAPSULE | Freq: Four times a day (QID) | ORAL | Status: DC | PRN

## 2011-01-21 MED ORDER — IBUPROFEN 600 MG PO TABS
600.0000 mg | ORAL_TABLET | Freq: Four times a day (QID) | ORAL | Status: DC
  Administered 2011-01-21 – 2011-01-23 (×7): 600 mg via ORAL
  Filled 2011-01-21 (×8): qty 1

## 2011-01-21 MED ORDER — ONDANSETRON HCL 4 MG PO TABS: 4.0000 mg | ORAL_TABLET | ORAL | Status: DC | PRN

## 2011-01-21 MED ORDER — ZOLPIDEM TARTRATE 5 MG PO TABS: 5.0000 mg | ORAL_TABLET | Freq: Every evening | ORAL | Status: DC | PRN

## 2011-01-21 MED ORDER — DIBUCAINE 1 % RE OINT: 1.0000 "application " | TOPICAL_OINTMENT | RECTAL | Status: DC | PRN

## 2011-01-21 MED ORDER — LANOLIN HYDROUS EX OINT: TOPICAL_OINTMENT | CUTANEOUS | Status: DC | PRN

## 2011-01-21 MED ORDER — SENNOSIDES-DOCUSATE SODIUM 8.6-50 MG PO TABS
2.0000 | ORAL_TABLET | Freq: Every day | ORAL | Status: DC
  Administered 2011-01-21 – 2011-01-22 (×2): 2 via ORAL

## 2011-01-21 MED ORDER — PRENATAL MULTIVITAMIN CH
1.0000 | ORAL_TABLET | Freq: Every day | ORAL | Status: DC
  Administered 2011-01-22 – 2011-01-23 (×2): 1 via ORAL
  Filled 2011-01-21 (×2): qty 1

## 2011-01-21 NOTE — Progress Notes (Signed)
Comfortable with epidural  FHR stable and reactive  UCs every 2-3 minutes  Cervix 9+/c/-1/BBOW  Will continue to observe

## 2011-01-21 NOTE — Progress Notes (Signed)
Now comfortable with epidural. FHR 120s with accels to 140s. Good variability UCs every 2-6 minutes Cervix 8cm now per RN Cervix was 7cm / Complete/ 0 / vtx 2 hours ago (did not chart accurately before)  Anticipate SVD tonight probably. Has already had NICU consult

## 2011-01-21 NOTE — Progress Notes (Signed)
Patient ID: Candace Booker, female   DOB: 1985/01/18, 26 y.o.   MRN: [id number]  Comfortable with epidural.  FHR 120s with good accels and no decels. UCs every 6-8 minutes.  Cervix 8cm per RN.  Cervical exam deferred for now.   Will continue to observe.

## 2011-01-21 NOTE — Progress Notes (Signed)
UR Chart review completed.  

## 2011-01-21 NOTE — Progress Notes (Signed)
Candace Booker is a 26 y.o. [phone] at [redacted]w[redacted]d by ultrasound admitted for preterm labor  Subjective:   Objective: BP 113/64  Pulse 92  Temp(Src) 97.9 F (36.6 C) (Oral)  Resp 18  Ht 5\' 6"  (1.676 m)  Wt 68.493 kg (151 lb)  BMI 24.37 kg/m2  SpO2 99% I/O last 3 completed shifts: In: 2028.8 [P.O.:1080; I.V.:598.8; IV Piggyback:350] Out: 550 [Urine:550] Total I/O In: 835 [P.O.:360; I.V.:475] Out: 675 [Urine:675]  FHT:  FHR: 140 bpm, variability: moderate,  accelerations:  Present,  decelerations:  Absent UC:   irregular, every 3 minutes SVE:   Dilation: 8 Effacement (%): 100 Station: 0 Exam by:: Carpenter,RN  Labs: Lab Results  Component Value Date   WBC 9.6 01/20/2011   HGB 11.2* 01/20/2011   HCT 32.9* 01/20/2011   MCV 96.5 01/20/2011   PLT 230 01/20/2011    Assessment / Plan: Spontaneous labor, progressing normally Preterm labor, unresponsive to Magnesium Sulfate  Labor: Progressing normally and progressing despite Magnesium Sulfate Preeclampsia:  n/a Fetal Wellbeing:  Category I Pain Control:  Epidural, desires I/D:  n/a Anticipated MOD:  NSVD Per Dr Jolayne Panther, keep on Magnesium until second Betamethasone injection. Updated on Cervical change.  [facility] 01/21/2011, 12:01 AM

## 2011-01-22 NOTE — Progress Notes (Signed)
Post Partum Day 1 Subjective: no complaints, up ad lib, voiding and tolerating PO  Objective: Blood pressure 104/58, pulse 64, temperature 99.3 F (37.4 C), temperature source Oral, resp. rate 18, height 5\' 6"  (1.676 m), weight 68.493 kg (151 lb), SpO2 100.00%, unknown if currently breastfeeding.  Physical Exam:  General: alert, cooperative and no distress Lochia: appropriate Uterine Fundus: firm DVT Evaluation: No evidence of DVT seen on physical exam.   Basename 01/20/11 1335  HGB 11.2*  HCT 32.9*    Assessment/Plan: Plan for discharge tomorrow and Breastfeeding   LOS: 2 days   Dom Haverland JEHIEL 01/22/2011, 12:31 PM

## 2011-01-22 NOTE — Progress Notes (Signed)
PSYCHOSOCIAL ASSESSMENT ~ MATERNAL/CHILD Name: Candace Booker.                                                                              Age: 25 day  Referral Date: 01/22/11   Reason/Source: NICU support, NPNC, Hx of MJ  I. FAMILY/HOME ENVIRONMENT A. Child's Legal Guardian _x__Parent(s) ___Grandparent ___Foster parent ___DSS_________________ Name: Candace Booker                                  DOB: [birth year]          Age: 67  Address: [address] [place], [place] [address]  Name: Candace Sane Sr.                         DOB: //                     Age: 92  Address: FOB does not live with [name]  B. Other Household Members/Support Persons Name:                                         Relationship: [name]                   Name: Candace Booker     Relationship: sister              DOB [birth year]                   Name: Candace Booker             Relationship: brother           DOB [birth year]                   Name:                                         Relationship:                        DOB ___/___/___  C. Other Support: [name] reports that she has a good support system of family and friends.  She moved to [place] approximately a year ago from [place], [place].  She has cousins and sisters in [place], as well as her mother, whom she lives with.  The rest of her family is in [place].   II. PSYCHOSOCIAL DATA A. Information Source  _x_Patient Interview  __Family Interview           __Other___________  B. Event organiser __Employment: __Medicaid    [place]:                 __Private Insurance:                   __Self Pay  _x_Food Stamps   __WIC __[employer] First     __Public Housing     __Section 8    __Maternity Care Coordination/Child Service Coordination/Early Intervention  __School:                                                                         Grade:   __Other:   Candace Booker Cultural and Environment Information Cultural Issues Impacting Care: none known  III. STRENGTHS _x__Supportive family/friends ___Adequate Resources _x__Compliance with medical plan ___Home prepared for Child (including basic supplies) _x__Understanding of illness      _x__Other: [name] would like to take baby and her other two children to  IV. RISK FACTORS AND CURRENT PROBLEMS         ____No Problems Noted                                                                                                                                                                                                                                                Pt              Family         Substance Abuse                                                                  _x__              ___             Mental  Illness                                                                        ___              ___  Family/Relationship Issues                                      ___               ___             Abuse/Neglect/Domestic Violence                                         ___         ___  Financial Resources                                        ___              ___             Transportation                                                                        ___               ___  DSS Involvement                                                                   ___              ___  Adjustment to Illness                                                               ___              ___  Knowledge/Cognitive Deficit                                                   ___              ___  Compliance with Treatment                                                 ___              ___  Basic Needs (food, housing, etc.)                                          ___              ___             Housing Concerns                                       ___               ___ Other_____________________________________________________________            V. SOCIAL WORK ASSESSMENT: SW met with [name] in her third floor room to introduce myself, complete assessment and evaluate how she is coping with baby's admission to NICU.  [name] was sleeping when SW knocked on the door, but she said SW could come in and talk with her.  She was very pleasant and seemed open with SW.  She states that she and FOB are "in between," when SW asked her about their relationship status.  She states that this is the third child for both of them, but first child together.  She states that he has been involved and supportive.  She has a 25 year old girl and 25 year old boy and he has two boys, ages 20 and 16.  [name] lives with her mother and two children.  FOB does not live in the home.  [name] states that she is from [place], [place] and moved here approximately one year ago.  She states that her son was born prematurely at [facility] and transferred to [place], [place] for NICU care.  She thinks that this baby's premature birth and NICU admission will be easier to cope with since she has had this experience before.  SW asked her about [facility] and she states that she found out she was pregnant at 3 months, but had a hard time getting Medicaid and could not be seen until it had been approved.  She admitted to the Marijuana use when asked and reports that it was an occasional occurrence and that she thinks the last time she used was mid December.  She states that she mostly used when she was feeling physically ill due to the pregnancy.  SW explained hospital drug screen policy and informed [name] that baby's UDS was negative.  She was very understanding.  SW asked which pediatrician [name] takes her children to and she said that she has not taken them anywhere in [place] yet.  SW suggests she gets them in to the pediatrician she wants to take her baby to.  She agreed and upon further discussion, states she would like to take  her children to [facility] on [facility].  SW will notify Candace Booker/Neonatal follow up coordinator.  SW asked if [name] has Sales executive and WIC and she states she  has Sales executive but does not have WIC.  SW told her to call WIC until she gets someone on the phone in order to enroll in the program.  She states that she has tried to pump, but is not sure she wants to continue.  SW told her about the benefits of breast milk for the baby, as long as she isn't smoking Marijuana, and [name] thinks she will keep trying.  [name] states that she does not have baby supplies since baby arrived early.  She still plans to have a baby shower and states that her plan was for FOB to get a crib with his tax return.  SW asked her to keep SW posted on whether or not they are able to get the bed before baby's d/c.  [name] asked if SW could help with diapers.  SW made referral to [employer] for [employer].  [name] seemed to be very appreciative of SW's visit and states no other questions or needs at this time.  SW gave contact information and explained support services offered by NICU SWs.  VI. SOCIAL WORK PLAN  ___No Further Intervention Required/No Barriers to Discharge   _x__Psychosocial Support and Ongoing Assessment of Needs   ___Patient/Family Education:   ___Child Protective Services Report   County___________ Date___/____/____   ___Information/Referral to [employer]_________________________   _x__Other: Cathren Laine

## 2011-01-23 DIAGNOSIS — Z8751 Personal history of pre-term labor: Secondary | ICD-10-CM

## 2011-01-23 MED ORDER — OXYCODONE-ACETAMINOPHEN 5-325 MG PO TABS: 1.0000 | ORAL_TABLET | ORAL | Status: AC | PRN

## 2011-01-23 NOTE — Discharge Summary (Signed)
Obstetric Discharge Summary Reason for Admission: onset of labor Prenatal Procedures: none Intrapartum Procedures: spontaneous vaginal delivery Postpartum Procedures: none Complications-Operative and Postpartum: none Hemoglobin  Date Value Range Status  01/20/2011 11.2* 12.0-15.0 (g/dL) Final     HCT  Date Value Range Status  01/20/2011 32.9* 36.0-46.0 (%) Final    Discharge Diagnoses: Premature labor  Discharge Information: Date: 01/23/2011 Activity: pelvic rest Diet: routine Medications: Percocet Condition: stable Instructions: refer to practice specific booklet Discharge to: home Follow-up Information    Follow up with [facility]. Schedule an appointment as soon as possible for a visit in 6 weeks.         Newborn Data: Live born female  Birth Weight: 3 lb 13.4 oz (1740 g) APGAR: 8, 8  Baby remains in NICU at time of mother discharge.  Candace Booker 01/23/2011, 12:04 PM

## 2011-01-23 NOTE — Progress Notes (Signed)
Post Partum Day 2 Subjective: no complaints, up ad lib, voiding, tolerating PO, + flatus and denies SHOB, CP, leg pain and swelling  Objective: Blood pressure 123/83, pulse 65, temperature 97.4 F (36.3 C), temperature source Oral, resp. rate 18, height 5\' 6"  (1.676 m), weight 68.493 kg (151 lb), SpO2 100.00%, unknown if currently breastfeeding.  Physical Exam:  General: alert, cooperative, appears stated age and no distress Lochia: appropriate Uterine Fundus: firm DVT Evaluation: No evidence of DVT seen on physical exam. Negative Homan's sign. No cords or calf tenderness. No significant calf/ankle edema. Lungs cta bilaterally Heart rrr no m/g/r   Basename 01/20/11 1335  HGB 11.2*  HCT 32.9*    Assessment/Plan: Discharge home Baby remains in NICU Anticipate BTL, will obtain appropriate paperwork    LOS: 3 days   Cameron Proud 01/23/2011, 9:07 AM

## 2011-01-23 NOTE — Progress Notes (Signed)
Pt d/c home ambulatory with family via personal car. D/c instructions and prescriptions reviewed pt verbalized understanding.  

## 2011-01-23 NOTE — Anesthesia Postprocedure Evaluation (Signed)
Patient stable following vaginal delivery.  

## 2011-01-28 NOTE — Discharge Summary (Signed)
Agree with above note.  Glyn Zendejas H. 01/28/2011 8:04 PM

## 2011-03-06 ENCOUNTER — Ambulatory Visit: Payer: Self-pay | Admitting: Advanced Practice Midwife

## 2011-12-25 IMAGING — CT CT ABD-PELV W/ CM
1 of 2 series · 15 of 32 positions shown, 19 images · IV contrast (agent unspecified)
Comparison: None.

CLINICAL DATA: Right lower quadrant pain.  Diarrhea.  Fever.

CT ABDOMEN AND PELVIS WITH CONTRAST
TECHNIQUE: Multidetector CT imaging of the abdomen and pelvis was
performed following the standard protocol during bolus
administration of intravenous contrast.
Contrast: 100  ml [id number]

[Series 2: routine abdomen/pelvis with · axial · 0.60mm/px · z∈[-145,+255]mm · 15 of 91 slices shown, 19 images]
[im 7/91  soft-tissue]
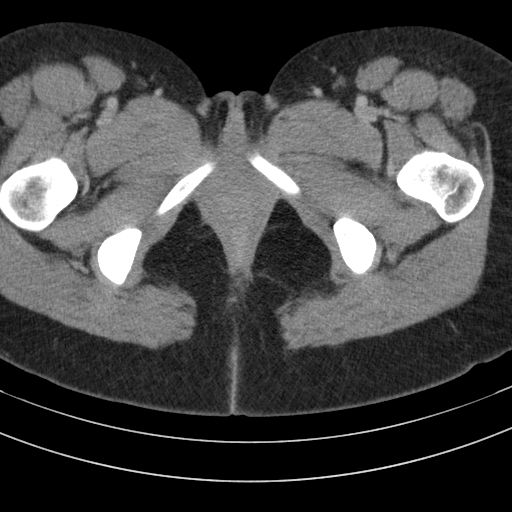
[im 7/91  bone]
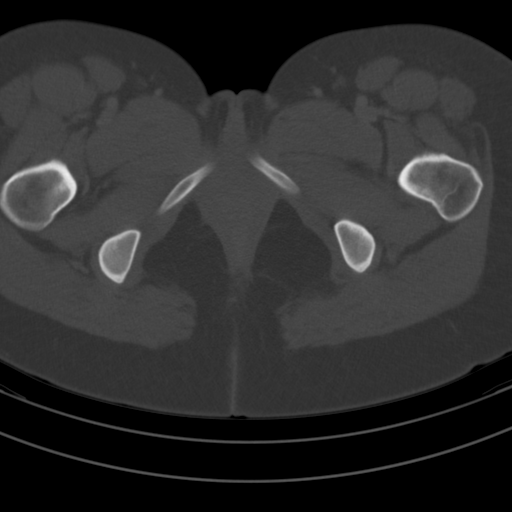
[im 13/91  soft-tissue]
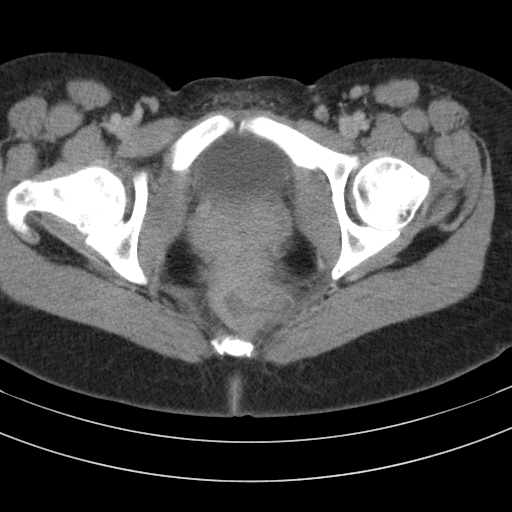
[im 20/91  soft-tissue]
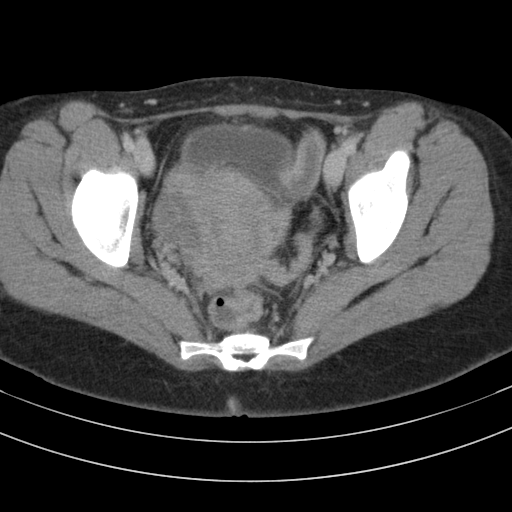
[im 26/91  soft-tissue]
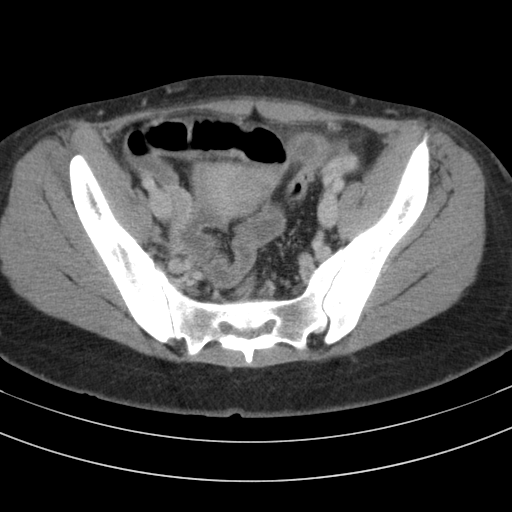
[im 33/91  soft-tissue]
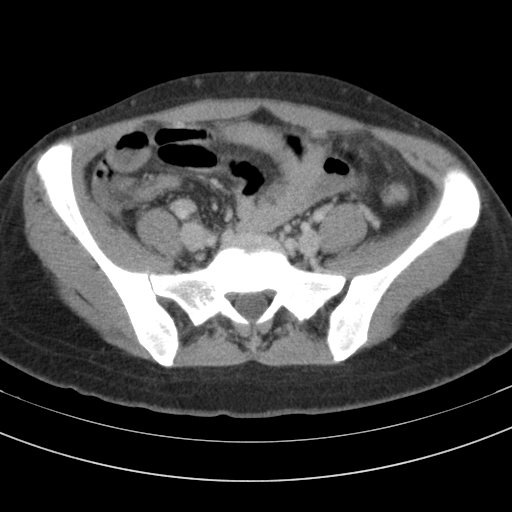
[im 39/91  soft-tissue]
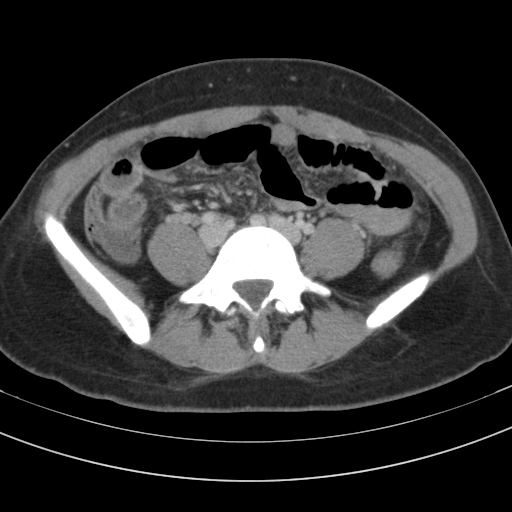
[im 46/91  soft-tissue]
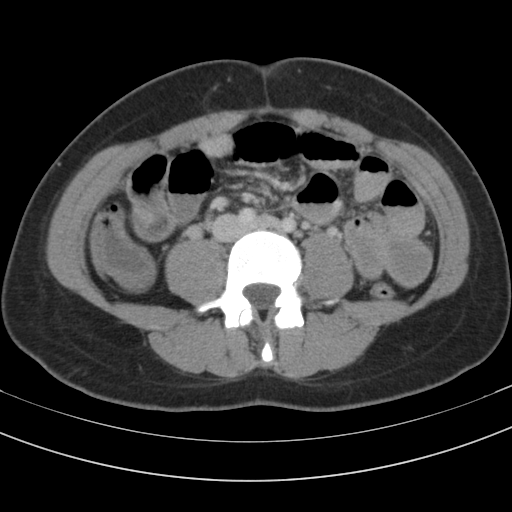
[im 52/91  soft-tissue]
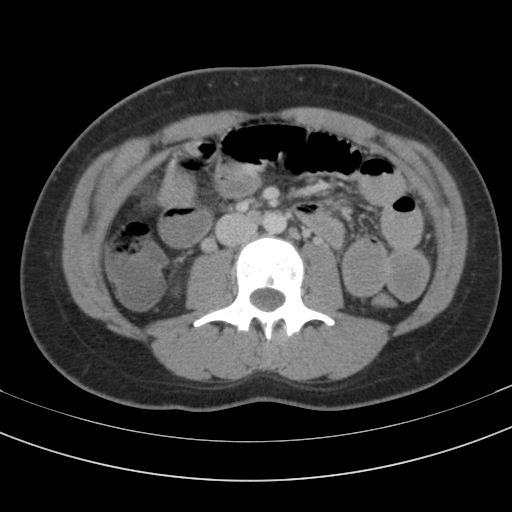
[im 58/91  soft-tissue]
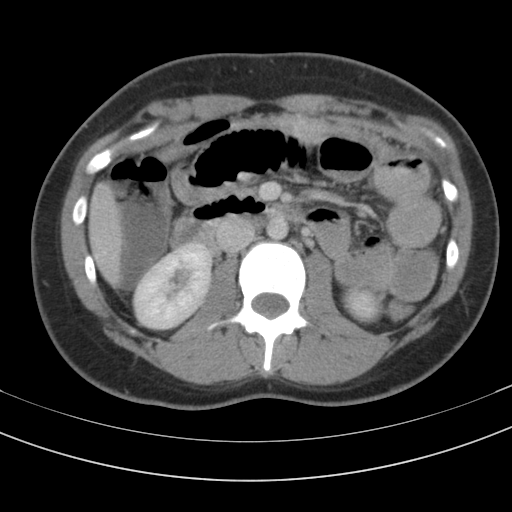
[im 58/91  bone]
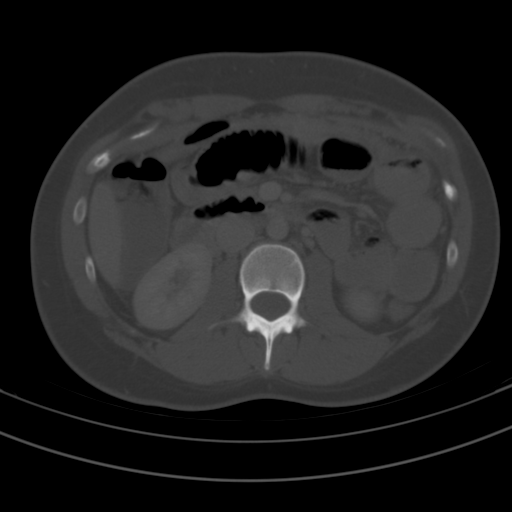
[im 65/91  soft-tissue]
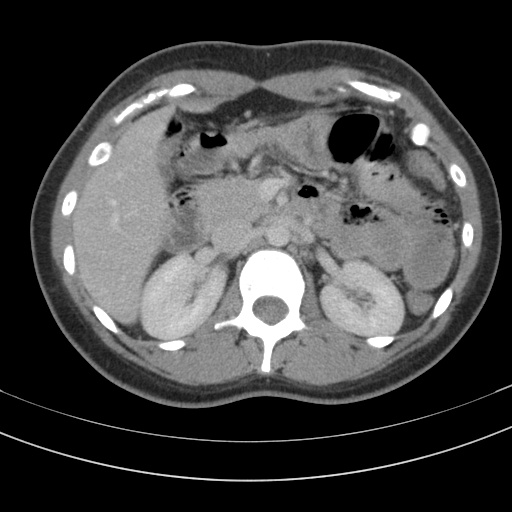
[im 71/91  soft-tissue]
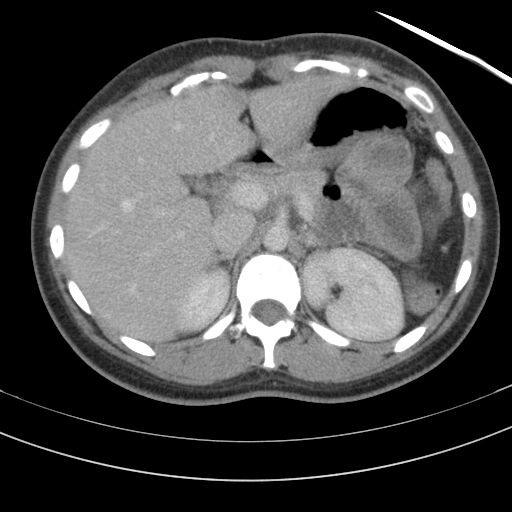
[im 78/91  soft-tissue]
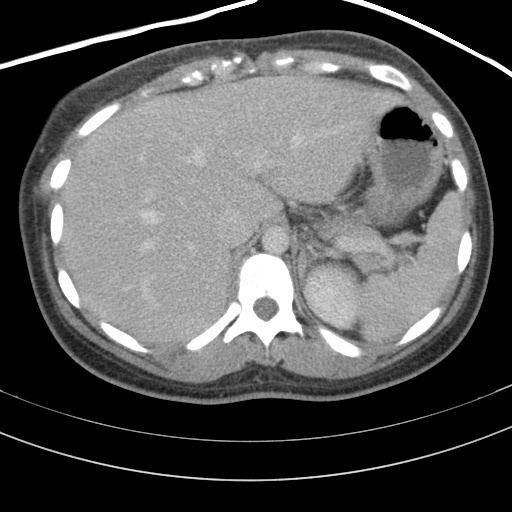
[im 78/91  lung]
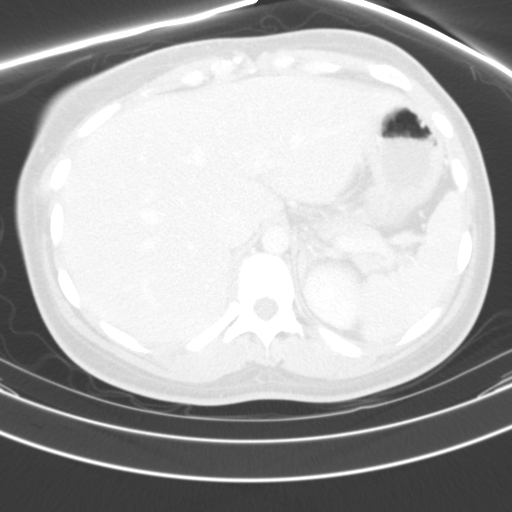
[im 81/91  lung]
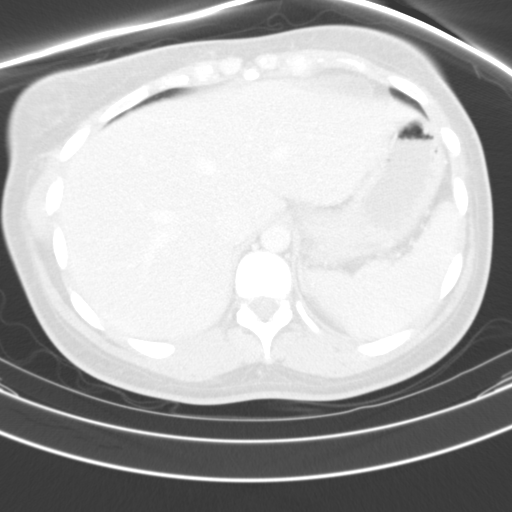
[im 84/91  soft-tissue]
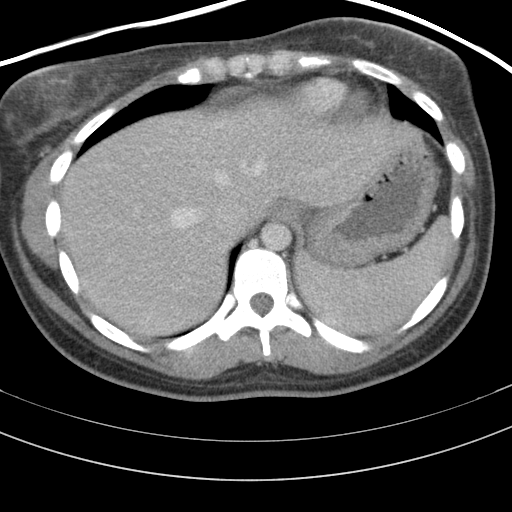
[im 84/91  lung]
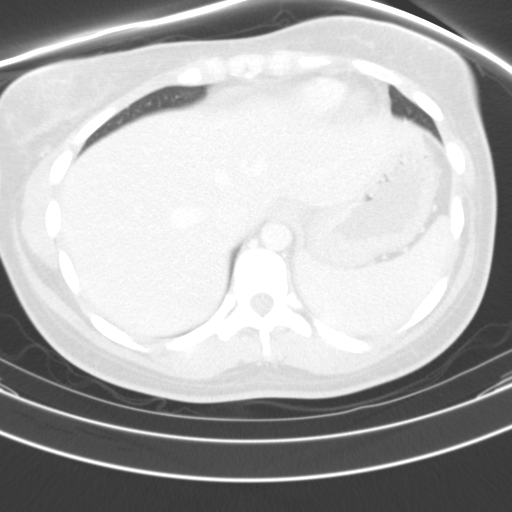
[im 87/91  lung]
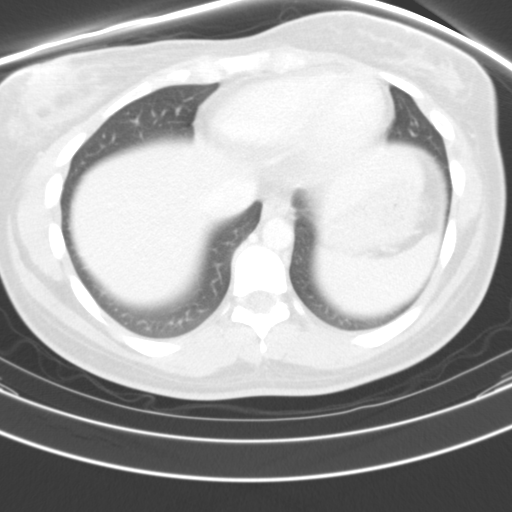

[15 of 32 positions shown; findings below may reference images not displayed]

FINDINGS: Clear lung bases.  Normal heart size without pericardial
or pleural effusion.  Mild motion degradation throughout.  Normal
liver, spleen, stomach, pancreas, gallbladder, biliary tract,
adrenal glands, kidneys. No retroperitoneal or retrocrural
adenopathy.

Normal appearance of the colon and terminal ileum.  The appendix is
difficult to visualize.  Is felt to be identified on coronal images
43 - 45 and transverse image 62.  No convincing evidence of
appendicitis.

Possible small bowel loops measure mildly dilated, up to 2.9 cm.
No focal transition point is identified.  Distal small bowel loops
are more normal in caliber.  There is no bowel wall thickening
identified.

No pelvic adenopathy.  Heterogeneity about the adnexa bilaterally.
Example image 69 of series 2 on the right.  No well-defined fluid
collection to suggest tubal ovarian abscess.  Question  edema also
surrounding the uterus. No significant free fluid.  No acute
osseous abnormality.
.
IMPRESSION: 1.  Mildly motion degraded exam.
2.  Subtle edema within the pelvis, nonspecific but can be seen
with pelvic inflammatory disease.  Clinically correlate.  No
evidence of abscess.
3.  The appendix is difficult to visualize but felt to be within
normal limits.
4.  Question mild adynamic ileus with borderline/mild proximal
small bowel dilatation.

## 2012-01-14 NOTE — L&D Delivery Note (Signed)
Delivery Note At 12:15 PM a viable and healthy female was delivered via Vaginal, Spontaneous Delivery (Presentation: Left Occiput Anterior).  APGAR: 9, 9; weight pending .   Placenta status: Intact, Spontaneous.  Cord: 3 vessels with the following complications: None.  Cord pH: n/a  Anesthesia: Epidural  Episiotomy: None Lacerations: None Est. Blood Loss (mL): 450  Mom to postpartum.  Baby to nursery-stable.  [facility] 07/28/2012, 1:21 PM

## 2012-03-17 ENCOUNTER — Emergency Department (HOSPITAL_COMMUNITY)
Admission: EM | Admit: 2012-03-17 | Discharge: 2012-03-17 | Disposition: A | Discharge status: Home / Self Care | Payer: Self-pay | Attending: Emergency Medicine | Admitting: Emergency Medicine

## 2012-03-17 DIAGNOSIS — Z8739 Personal history of other diseases of the musculoskeletal system and connective tissue: Secondary | ICD-10-CM | POA: Insufficient documentation

## 2012-03-17 DIAGNOSIS — J45909 Unspecified asthma, uncomplicated: Secondary | ICD-10-CM | POA: Insufficient documentation

## 2012-03-17 DIAGNOSIS — Z87891 Personal history of nicotine dependence: Secondary | ICD-10-CM | POA: Insufficient documentation

## 2012-03-17 DIAGNOSIS — Z331 Pregnant state, incidental: Secondary | ICD-10-CM | POA: Insufficient documentation

## 2012-03-17 DIAGNOSIS — R112 Nausea with vomiting, unspecified: Secondary | ICD-10-CM | POA: Insufficient documentation

## 2012-03-17 LAB — URINE MICROSCOPIC-ADD ON

## 2012-03-17 LAB — URINALYSIS, ROUTINE W REFLEX MICROSCOPIC
Bilirubin Urine: NEGATIVE
Glucose, UA: NEGATIVE mg/dL
Hgb urine dipstick: NEGATIVE
Ketones, ur: >80 mg/dL — AB
Nitrite: NEGATIVE
Protein, ur: NEGATIVE mg/dL
Specific Gravity, Urine: 1.026 (ref 1.005–1.030)
Urobilinogen, UA: 0.2 mg/dL (ref 0.0–1.0)
pH: 5.5 (ref 5.0–8.0)

## 2012-03-17 LAB — POCT I-STAT, CHEM 8
HCT: 37 % (ref 36.0–46.0)
Hemoglobin: 12.6 g/dL (ref 12.0–15.0)
Potassium: 3.9 mEq/L (ref 3.5–5.1)
Sodium: 138 mEq/L (ref 135–145)
TCO2: 25 mmol/L (ref 0–100)

## 2012-03-17 LAB — LIPASE, BLOOD: Lipase: 28 U/L (ref 11–59)

## 2012-03-17 LAB — POCT PREGNANCY, URINE: Preg Test, Ur: POSITIVE — AB

## 2012-03-17 MED ORDER — ONDANSETRON HCL 4 MG/2ML IJ SOLN
4.0000 mg | Freq: Once | INTRAMUSCULAR | Status: AC
  Administered 2012-03-17: 4 mg via INTRAVENOUS
  Filled 2012-03-17: qty 2

## 2012-03-17 MED ORDER — ONDANSETRON 4 MG PO TBDP: 4.0000 mg | ORAL_TABLET | Freq: Once | ORAL | Status: DC

## 2012-03-17 MED ORDER — PRENATAL COMPLETE 14-0.4 MG PO TABS: 1.0000 | ORAL_TABLET | Freq: Every day | ORAL | Status: DC

## 2012-03-17 MED ORDER — ONDANSETRON HCL 4 MG PO TABS: 4.0000 mg | ORAL_TABLET | Freq: Four times a day (QID) | ORAL | Status: DC

## 2012-03-17 MED ORDER — SODIUM CHLORIDE 0.9 % IV BOLUS (SEPSIS)
1000.0000 mL | Freq: Once | INTRAVENOUS | Status: AC
  Administered 2012-03-17: 1000 mL via INTRAVENOUS

## 2012-03-17 NOTE — ED Provider Notes (Signed)
History     CSN: [id number]  Arrival date & time 03/17/12  1237   First MD Initiated Contact with Patient 03/17/12 1503      Chief Complaint  Patient presents with  . Emesis    HPI Candace Booker is a 27 y.o. female who presents to the ED with n/v x 1 day.  Reports that she vomited x 3.  NBNB.  No abdominal pain.  No vaginal bleeding. Reports that her LMP was 10/18/11.  Concerned she may be pregnant.  No fevers.  Mild headache.  Similar to previous pregnancy.  No other symptoms.   Past Medical History  Diagnosis Date  . Asthma   . Scoliosis     Past Surgical History  Procedure Laterality Date  . No past surgeries      Family History  Problem Relation Age of Onset  . Diabetes Maternal Aunt     History  Substance Use Topics  . Smoking status: Former Smoker    Quit date: 08/29/2010  . Smokeless tobacco: Not on file  . Alcohol Use: No    OB History   Grav Para Term Preterm Abortions TAB SAB Ect Mult Living   3 3 1 2  0 0 0 0 0 3      Review of Systems  Constitutional: Negative for fever and chills.  HENT: Negative for congestion, rhinorrhea, neck pain and neck stiffness.   Respiratory: Negative for cough and shortness of breath.   Cardiovascular: Negative for chest pain.  Gastrointestinal: Positive for nausea and vomiting. Negative for abdominal pain, diarrhea and abdominal distention.  Endocrine: Negative for polyuria.  Genitourinary: Negative for dysuria.  Skin: Negative for rash.  Neurological: Negative for headaches.  Psychiatric/Behavioral: Negative.   All other systems reviewed and are negative.    Allergies  Review of patient's allergies indicates no known allergies.  Home Medications  No current outpatient prescriptions on file.  BP 108/65  Pulse 98  Temp(Src) 98.5 F (36.9 C) (Oral)  Resp 18  SpO2 100%  LMP 10/18/2011  Physical Exam  Nursing note and vitals reviewed. Constitutional: She is oriented to person, place, and time. She  appears well-developed and well-nourished. No distress.  HENT:  Head: Normocephalic and atraumatic.  Right Ear: External ear normal.  Left Ear: External ear normal.  Nose: Nose normal.  Mouth/Throat: Oropharynx is clear and moist. No oropharyngeal exudate.  Eyes: EOM are normal. Pupils are equal, round, and reactive to light.  Neck: Normal range of motion. Neck supple. No tracheal deviation present.  Cardiovascular: Normal rate.   Pulmonary/Chest: Effort normal and breath sounds normal. No stridor. No respiratory distress. She has no wheezes. She has no rales.  Abdominal: Soft. She exhibits no distension. There is no tenderness. There is no rebound.  Musculoskeletal: Normal range of motion.  Neurological: She is alert and oriented to person, place, and time.  Skin: Skin is warm and dry. She is not diaphoretic.    ED Course  Procedures (including critical care time)  Labs Reviewed  URINALYSIS, ROUTINE W REFLEX MICROSCOPIC - Abnormal; Notable for the following:    Color, Urine AMBER (*)    APPearance CLOUDY (*)    Ketones, ur >80 (*)    Leukocytes, UA MODERATE (*)    All other components within normal limits  POCT PREGNANCY, URINE - Abnormal; Notable for the following:    Preg Test, Ur POSITIVE (*)    All other components within normal limits  LIPASE, BLOOD  URINE MICROSCOPIC-ADD  ON  POCT I-STAT, CHEM 8   No results found.   1. Nausea & vomiting   2. Pregnancy       MDM   27 year old female who presents to the ED with mild nausea and vomiting and concern she may be pregnant.  Pregnancy test positive.  Otherwise basic labs benign. Abdominal exam completely benign. Patient given Zofran with complete resolution of symptoms. Transabdominal pregnancy ultrasound revealed intrauterine pregnancy with fetal heart tones of 156. Patient rehydrated with normal saline. Recommend patient to reestablish care with her obstetrician for routine pregnancy care and ultrasound. Patient states  her discharge. No evidence of appendicitis biliary disease urinary tract infection kidney stone or other serious intra-abdominal cause of symptoms. Patient discharged.      Arloa Koh, MD 03/17/12 2310

## 2012-03-17 NOTE — ED Notes (Signed)
Pt denies visual changes with HA.

## 2012-03-17 NOTE — ED Notes (Signed)
Pt reports runny nose, cough, congestion, nausea, vomiting, headache and body aches since last night. Denies fever. + sick contacts at home. Pt alert, NAD. VSS.

## 2012-03-25 NOTE — ED Provider Notes (Signed)
I saw and evaluated the patient, reviewed the resident's note and I agree with the findings and plan.  26yF with n/v. Pregnant. Bedside US showed IUP with FHT in 150s. Prenatal vit. Ob fu.   Raeford Razor, MD 03/25/12 [phone]

## 2012-04-13 ENCOUNTER — Encounter (HOSPITAL_COMMUNITY): Payer: Self-pay | Admitting: *Deleted

## 2012-04-13 ENCOUNTER — Inpatient Hospital Stay (HOSPITAL_COMMUNITY)
Admission: AD | Admit: 2012-04-13 | Discharge: 2012-04-13 | Disposition: A | Discharge status: Home / Self Care | Payer: Medicaid Other | Source: Ambulatory Visit | Attending: Obstetrics & Gynecology | Admitting: Obstetrics & Gynecology

## 2012-04-13 ENCOUNTER — Inpatient Hospital Stay (HOSPITAL_COMMUNITY): Payer: Medicaid Other

## 2012-04-13 DIAGNOSIS — Z8751 Personal history of pre-term labor: Secondary | ICD-10-CM

## 2012-04-13 DIAGNOSIS — R109 Unspecified abdominal pain: Secondary | ICD-10-CM | POA: Insufficient documentation

## 2012-04-13 DIAGNOSIS — N949 Unspecified condition associated with female genital organs and menstrual cycle: Secondary | ICD-10-CM | POA: Insufficient documentation

## 2012-04-13 DIAGNOSIS — B9689 Other specified bacterial agents as the cause of diseases classified elsewhere: Secondary | ICD-10-CM

## 2012-04-13 DIAGNOSIS — B373 Candidiasis of vulva and vagina: Secondary | ICD-10-CM

## 2012-04-13 DIAGNOSIS — M25559 Pain in unspecified hip: Secondary | ICD-10-CM | POA: Insufficient documentation

## 2012-04-13 DIAGNOSIS — A599 Trichomoniasis, unspecified: Secondary | ICD-10-CM

## 2012-04-13 LAB — RAPID URINE DRUG SCREEN, HOSP PERFORMED
Amphetamines: NOT DETECTED
Barbiturates: NOT DETECTED
Benzodiazepines: NOT DETECTED
Cocaine: NOT DETECTED
Tetrahydrocannabinol: POSITIVE — AB

## 2012-04-13 LAB — URINALYSIS, ROUTINE W REFLEX MICROSCOPIC
Bilirubin Urine: NEGATIVE
Ketones, ur: NEGATIVE mg/dL
Nitrite: NEGATIVE
Protein, ur: NEGATIVE mg/dL
Urobilinogen, UA: 0.2 mg/dL (ref 0.0–1.0)

## 2012-04-13 LAB — WET PREP, GENITAL

## 2012-04-13 LAB — URINE MICROSCOPIC-ADD ON

## 2012-04-13 MED ORDER — FLUCONAZOLE 150 MG PO TABS
150.0000 mg | ORAL_TABLET | Freq: Once | ORAL | Status: AC
  Administered 2012-04-13: 150 mg via ORAL
  Filled 2012-04-13: qty 1

## 2012-04-13 MED ORDER — FLUCONAZOLE 150 MG PO TABS: 150.0000 mg | ORAL_TABLET | Freq: Once | ORAL | Status: DC

## 2012-04-13 MED ORDER — METRONIDAZOLE 500 MG PO TABS
2000.0000 mg | ORAL_TABLET | Freq: Once | ORAL | Status: AC
  Administered 2012-04-13: 2000 mg via ORAL
  Filled 2012-04-13: qty 4

## 2012-04-13 NOTE — MAU Note (Signed)
Pt states she is having lower abd pain, unsure if she is pregnant, LMP was in October, has not done HPT.  Doppler FHR obtained - 155.

## 2012-04-13 NOTE — ED Provider Notes (Deleted)
History     CSN: [id number]  Arrival date & time 04/13/12  1034   First Provider Initiated Contact with Patient 04/13/12 1216      Chief Complaint  Patient presents with  . Abdominal Pain    HPI  Patient is a 27 yo female who presents to MAU today complaining of lower abdominal pain/pressure. Denies bleeding, fluid leaking, or discharge. Pt was not entirely sure she was pregnant prior to coming into MAU today but thought she might be as her last period started on October 5. Prior to that periods they were fairly regular. She was not on birth control at that time and has been intermittently sexually active since then. She has not had any prenatal care thus far. Has felt baby move beginning in March.   Endorses mild dysuria. Also complains of the side of her hip hurting and feeling numbness down into her leg. Occasionally has a headache, but no vision changes or lower extremity edema. Denies a hx of HTN or diabetes. No leg pain or rashes.  Does not have a regular doctor. Does not take any medications. No prior surgeries.  Has had three previous pregnancies, two of which were preterm deliveries. All of her children are doing well.  History of 2 preterm deliveries - 33 weeks (presented with contractions and bleeding) and 7.5 months (not sure of reason or presentation).  OB History   Grav Para Term Preterm Abortions TAB SAB Ect Mult Living   4 3 1 2  0 0 0 0 0 3      Past Medical History  Diagnosis Date  . Asthma   . Scoliosis     Past Surgical History  Procedure Laterality Date  . No past surgeries      Family History  Problem Relation Age of Onset  . Diabetes Maternal Aunt     History  Substance Use Topics  . Smoking status: Current Every Day Smoker -- 0.25 packs/day    Types: Cigarettes    Last Attempt to Quit: 08/29/2010  . Smokeless tobacco: Not on file  . Alcohol Use: No  Does endorse marijuana use, last time two days ago   Review of Systems  Constitutional:  Negative for fever and chills.  Genitourinary: Negative for urgency and vaginal bleeding.  Neurological: Negative for dizziness and headaches.    Allergies  Review of patient's allergies indicates no known allergies.  Home Medications   Current Outpatient Rx  Name  Route  Sig  Dispense  Refill  . Prenatal Vit-Fe Fumarate-FA (PRENATAL MULTIVITAMIN) TABS   Oral   Take 1 tablet by mouth daily at 12 noon.         . fluconazole (DIFLUCAN) 150 MG tablet   Oral   Take 1 tablet (150 mg total) by mouth once. Take in 5 days.   1 tablet   0     BP 114/84  Pulse 82  Temp(Src) 98.1 F (36.7 C) (Oral)  Resp 18  LMP 10/18/2011  Physical Exam Gen: NAD Heart: RRR Lungs: CTAB, NWOB Abd: gravid, uterus measures approximately 28 cm Neuro: nonfocal, speech intact GU: cervical exam from Dr. Thad Ranger: closed, thick, high, normal external genitalia and vagina. Moderate white chunky and watery discharge.  Fern negative  MAU Course  Procedures (including critical care time)  Labs Reviewed  WET PREP, GENITAL - Abnormal; Notable for the following:    Yeast Wet Prep HPF POC FEW (*)    Trich, Wet Prep FEW (*)  Clue Cells Wet Prep HPF POC MODERATE (*)    WBC, Wet Prep HPF POC MODERATE (*)    All other components within normal limits  URINALYSIS, ROUTINE W REFLEX MICROSCOPIC - Abnormal; Notable for the following:    Leukocytes, UA SMALL (*)    All other components within normal limits  URINE RAPID DRUG SCREEN (HOSP PERFORMED) - Abnormal; Notable for the following:    Tetrahydrocannabinol POSITIVE (*)    All other components within normal limits  GC/CHLAMYDIA PROBE AMP  URINE CULTURE  URINE MICROSCOPIC-ADD ON     FHR:  140, moderate variability, accels present, occasional variable (appropriate for gestational age) TOCO:  No ctx, irritability  1. Trichimoniasis   2. BV (bacterial vaginosis)   3. Yeast vaginitis   4. History of pre-term labor     MDM  27 yo female [phone] who  presents at [redacted]w[redacted]d with chief complaint of pelvic pressure. Does not appear to be in labor now; cervix is closed thick and high per Dr. Westley Hummer exam. Limited OB u/s shows cervix to be 3.4 cm in length with normal amount of amniotic fluid.  Wet prep significant for trichomonas, yeast, and BV. Will treat with 2000 mg flagyl PO once now and diflucan 150mg  once now, with an additional diflucan dose in 1 week if needed. Will send urine for culture. Pt and her partner were counseled that he needs to be treated as well to prevent reinfection with trichomonas.  Pt has had no prenatal care but will get established with [facility] for hx of preterm deliveries x 2. Pt would benefit from 17-P injections, will attempt to set this up for patient this coming week.  Pt discussed with and examined by Dr. Thad Ranger as well.  Latrelle Dodrill, MD 04/13/12 1929  I saw and examined patient and agree with above resident note. I reviewed history, imaging, labs, and vitals. I personally reviewed the fetal heart tracing, and it is reactive and appropriate for gestational age. I spoke with Dr. Sherrie George in MFM who suggested that there is some evidence that 17-P started up to 26 weeks could be beneficial in preventing preterm labor. The patient is agreeable to starting 17-P. However, we will get a complete OB sono on 04/15/12 to determine dating, as I suspect she may be over 26 weeks. She will be scheduled in [facility] Clinic due to history of preterm deliveries. Per clinic staff, the earliest we can get 17-P for her is Monday 04/19/12, which may be too late.  Napoleon Form, MD

## 2012-04-13 NOTE — MAU Note (Signed)
Pelvic pain & mid abd pain around umbilicus x 2 days, also R hip pain.  Pt denies bleeding or discharge.

## 2012-04-14 LAB — URINE CULTURE: Special Requests: NORMAL

## 2012-04-15 ENCOUNTER — Ambulatory Visit (HOSPITAL_COMMUNITY)
Admission: RE | Admit: 2012-04-15 | Discharge: 2012-04-15 | Disposition: A | Discharge status: Home / Self Care | Payer: Medicaid Other | Source: Ambulatory Visit | Attending: Family Medicine | Admitting: Family Medicine

## 2012-04-15 DIAGNOSIS — A599 Trichomoniasis, unspecified: Secondary | ICD-10-CM

## 2012-04-15 DIAGNOSIS — B373 Candidiasis of vulva and vagina: Secondary | ICD-10-CM

## 2012-04-15 DIAGNOSIS — O99330 Smoking (tobacco) complicating pregnancy, unspecified trimester: Secondary | ICD-10-CM | POA: Insufficient documentation

## 2012-04-15 DIAGNOSIS — B9689 Other specified bacterial agents as the cause of diseases classified elsewhere: Secondary | ICD-10-CM

## 2012-04-15 DIAGNOSIS — O0930 Supervision of pregnancy with insufficient antenatal care, unspecified trimester: Secondary | ICD-10-CM | POA: Insufficient documentation

## 2012-04-15 DIAGNOSIS — Z3689 Encounter for other specified antenatal screening: Secondary | ICD-10-CM | POA: Insufficient documentation

## 2012-04-15 DIAGNOSIS — Z8751 Personal history of pre-term labor: Secondary | ICD-10-CM

## 2012-04-22 ENCOUNTER — Ambulatory Visit (INDEPENDENT_AMBULATORY_CARE_PROVIDER_SITE_OTHER): Payer: Self-pay | Admitting: Obstetrics and Gynecology

## 2012-04-22 ENCOUNTER — Other Ambulatory Visit: Payer: Self-pay | Admitting: Obstetrics and Gynecology

## 2012-04-22 ENCOUNTER — Encounter: Payer: Self-pay | Admitting: Obstetrics and Gynecology

## 2012-04-22 ENCOUNTER — Encounter: Payer: Self-pay | Admitting: *Deleted

## 2012-04-22 VITALS — BP 109/67 | Temp 97.7°F | Wt 165.4 lb

## 2012-04-22 DIAGNOSIS — O09299 Supervision of pregnancy with other poor reproductive or obstetric history, unspecified trimester: Secondary | ICD-10-CM

## 2012-04-22 DIAGNOSIS — Z302 Encounter for sterilization: Secondary | ICD-10-CM | POA: Insufficient documentation

## 2012-04-22 DIAGNOSIS — O0932 Supervision of pregnancy with insufficient antenatal care, second trimester: Secondary | ICD-10-CM

## 2012-04-22 DIAGNOSIS — O09219 Supervision of pregnancy with history of pre-term labor, unspecified trimester: Secondary | ICD-10-CM | POA: Insufficient documentation

## 2012-04-22 DIAGNOSIS — O0930 Supervision of pregnancy with insufficient antenatal care, unspecified trimester: Secondary | ICD-10-CM | POA: Insufficient documentation

## 2012-04-22 DIAGNOSIS — O0993 Supervision of high risk pregnancy, unspecified, third trimester: Secondary | ICD-10-CM | POA: Insufficient documentation

## 2012-04-22 DIAGNOSIS — O09212 Supervision of pregnancy with history of pre-term labor, second trimester: Secondary | ICD-10-CM

## 2012-04-22 DIAGNOSIS — O0992 Supervision of high risk pregnancy, unspecified, second trimester: Secondary | ICD-10-CM

## 2012-04-22 LAB — POCT URINALYSIS DIP (DEVICE)
Bilirubin Urine: NEGATIVE
Glucose, UA: NEGATIVE mg/dL
Hgb urine dipstick: NEGATIVE
Specific Gravity, Urine: 1.025 (ref 1.005–1.030)

## 2012-04-22 MED ORDER — PROGESTERONE MICRONIZED 200 MG PO CAPS: ORAL_CAPSULE | ORAL | Status: DC

## 2012-04-22 MED ORDER — HYDROXYPROGESTERONE CAPROATE 250 MG/ML IM OIL
250.0000 mg | TOPICAL_OIL | INTRAMUSCULAR | Status: DC
  Administered 2012-05-13 – 2012-07-22 (×10): 250 mg via INTRAMUSCULAR

## 2012-04-22 NOTE — Patient Instructions (Addendum)
Breastfeeding Deciding to breastfeed is one of the best choices you can make for you and your baby. The information that follows gives a brief overview of the benefits of breastfeeding as well as common topics surrounding breastfeeding. BENEFITS OF BREASTFEEDING For the baby  The first milk (colostrum) helps the baby's digestive system function better.   There are antibodies in the mother's milk that help the baby fight off infections.   The baby has a lower incidence of asthma, allergies, and sudden infant death syndrome (SIDS).   The nutrients in breast milk are better for the baby than infant formulas, and breast milk helps the baby's brain grow better.   Babies who breastfeed have less gas, colic, and constipation.  For the mother  Breastfeeding helps develop a very special bond between the mother and her baby.   Breastfeeding is convenient, always available at the correct temperature, and costs nothing.   Breastfeeding burns calories in the mother and helps her lose weight that was gained during pregnancy.   Breastfeeding makes the uterus contract back down to normal size faster and slows bleeding following delivery.   Breastfeeding mothers have a lower risk of developing breast cancer.  BREASTFEEDING FREQUENCY  A healthy, full-term baby may breastfeed as often as every hour or space his or her feedings to every 3 hours.   Watch your baby for signs of hunger. Nurse your baby if he or she shows signs of hunger. How often you nurse will vary from baby to baby.   Nurse as often as the baby requests, or when you feel the need to reduce the fullness of your breasts.   Awaken the baby if it has been 3 4 hours since the last feeding.   Frequent feeding will help the mother make more milk and will help prevent problems, such as sore nipples and engorgement of the breasts.  BABY'S POSITION AT THE BREAST  Whether lying down or sitting, be sure that the baby's tummy  is facing your tummy.   Support the breast with 4 fingers underneath the breast and the thumb above. Make sure your fingers are well away from the nipple and baby's mouth.   Stroke the baby's lips gently with your finger or nipple.   When the baby's mouth is open wide enough, place all of your nipple and as much of the areola as possible into your baby's mouth.   Pull the baby in close so the tip of the nose and the baby's cheeks touch the breast during the feeding.  FEEDINGS AND SUCTION  The length of each feeding varies from baby to baby and from feeding to feeding.   The baby must suck about 2 3 minutes for your milk to get to him or her. This is called a "let down." For this reason, allow the baby to feed on each breast as long as he or she wants. Your baby will end the feeding when he or she has received the right balance of nutrients.   To break the suction, put your finger into the corner of the baby's mouth and slide it between his or her gums before removing your breast from his or her mouth. This will help prevent sore nipples.  HOW TO TELL WHETHER YOUR BABY IS GETTING ENOUGH BREAST MILK. Wondering whether or not your baby is getting enough milk is a common concern among mothers. You can be assured that your baby is getting enough milk if:   Your baby is actively  sucking and you hear swallowing.   Your baby seems relaxed and satisfied after a feeding.   Your baby nurses at least 8 12 times in a 24 hour time period. Nurse your baby until he or she unlatches or falls asleep at the first breast (at least 10 20 minutes), then offer the second side.   Your baby is wetting 5 6 disposable diapers (6 8 cloth diapers) in a 24 hour period by 34 45 days of age.   Your baby is having at least 3 4 stools every 24 hours for the first 6 weeks. The stool should be soft and yellow.   Your baby should gain 4 7 ounces per week after he or she is 50 days old.   Your breasts feel  softer after nursing.  REDUCING BREAST ENGORGEMENT  In the first week after your baby is born, you may experience signs of breast engorgement. When breasts are engorged, they feel heavy, warm, full, and may be tender to the touch. You can reduce engorgement if you:   Nurse frequently, every 2 3 hours. Mothers who breastfeed early and often have fewer problems with engorgement.   Place light ice packs on your breasts for 10 20 minutes between feedings. This reduces swelling. Wrap the ice packs in a lightweight towel to protect your skin. Bags of frozen vegetables work well for this purpose.   Take a warm shower or apply warm, moist heat to your breast for 5 10 minutes just before each feeding. This increases circulation and helps the milk flow.   Gently massage your breast before and during the feeding. Using your finger tips, massage from the chest wall towards your nipple in a circular motion.   Make sure that the baby empties at least one breast at every feeding before switching sides.   Use a breast pump to empty the breasts if your baby is sleepy or not nursing well. You may also want to pump if you are returning to work oryou feel you are getting engorged.   Avoid bottle feeds, pacifiers, or supplemental feedings of water or juice in place of breastfeeding. Breast milk is all the food your baby needs. It is not necessary for your baby to have water or formula. In fact, to help your breasts make more milk, it is best not to give your baby supplemental feedings during the early weeks.   Be sure the baby is latched on and positioned properly while breastfeeding.   Wear a supportive bra, avoiding underwire styles.   Eat a balanced diet with enough fluids.   Rest often, relax, and take your prenatal vitamins to prevent fatigue, stress, and anemia.  If you follow these suggestions, your engorgement should improve in 24 48 hours. If you are still experiencing difficulty, call  your lactation consultant or caregiver.  CARING FOR YOURSELF Take care of your breasts  Bathe or shower daily.   Avoid using soap on your nipples.   Start feedings on your left breast at one feeding and on your right breast at the next feeding.   You will notice an increase in your milk supply 2 5 days after delivery. You may feel some discomfort from engorgement, which makes your breasts very firm and often tender. Engorgement "peaks" out within 24 48 hours. In the meantime, apply warm moist towels to your breasts for 5 10 minutes before feeding. Gentle massage and expression of some milk before feeding will soften your breasts, making it easier for your  baby to latch on.   Wear a well-fitting nursing bra, and air dry your nipples for a 3 after each feeding.   Only use cotton bra pads.   Only use pure lanolin on your nipples after nursing. You do not need to wash it off before feeding the baby again. Another option is to express a few drops of breast milk and gently massage it into your nipples.  Take care of yourself  Eat well-balanced meals and nutritious snacks.   Drinking milk, fruit juice, and water to satisfy your thirst (about 8 glasses a day).   Get plenty of rest.  Avoid foods that you notice affect the baby in a bad way.  SEEK MEDICAL CARE IF:   You have difficulty with breastfeeding and need help.   You have a hard, red, sore area on your breast that is accompanied by a fever.   Your baby is too sleepy to eat well or is having trouble sleeping.   Your baby is wetting less than 6 diapers a day, by 66 days of age.   Your baby's skin or white part of his or her eyes is more yellow than it was in the hospital.   You feel depressed.  Document Released: 12/30/2004 Document Revised: 07/01/2011 Document Reviewed: 03/30/2011 [facility] Patient Information 2013 [place], [place].   Pregnancy - Second Trimester The second trimester of pregnancy (3 to  6 months) is a period of rapid growth for you and your baby. At the end of the sixth month, your baby is about 9 inches long and weighs 1 1/2 pounds. You will begin to feel the baby move between 18 and 20 weeks of the pregnancy. This is called quickening. Weight gain is faster. A clear fluid (colostrum) may leak out of your breasts. You may feel small contractions of the womb (uterus). This is known as false labor or Braxton-Hicks contractions. This is like a practice for labor when the baby is ready to be born. Usually, the problems with morning sickness have usually passed by the end of your first trimester. Some women develop small dark blotches (called cholasma, mask of pregnancy) on their face that usually goes away after the baby is born. Exposure to the sun makes the blotches worse. Acne may also develop in some pregnant women and pregnant women who have acne, may find that it goes away. PRENATAL EXAMS  Blood work may continue to be done during prenatal exams. These tests are done to check on your health and the probable health of your baby. Blood work is used to follow your blood levels (hemoglobin). Anemia (low hemoglobin) is common during pregnancy. Iron and vitamins are given to help prevent this. You will also be checked for diabetes between 24 and 28 weeks of the pregnancy. Some of the previous blood tests may be repeated.  The size of the uterus is measured during each visit. This is to make sure that the baby is continuing to grow properly according to the dates of the pregnancy.  Your blood pressure is checked every prenatal visit. This is to make sure you are not getting toxemia.  Your urine is checked to make sure you do not have an infection, diabetes or protein in the urine.  Your weight is checked often to make sure gains are happening at the suggested rate. This is to ensure that both you and your baby are growing normally.  Sometimes, an ultrasound is performed to confirm the  proper growth and development  of the baby. This is a test which bounces harmless sound waves off the baby so your caregiver can more accurately determine due dates. Sometimes, a specialized test is done on the amniotic fluid surrounding the baby. This test is called an amniocentesis. The amniotic fluid is obtained by sticking a needle into the belly (abdomen). This is done to check the chromosomes in instances where there is a concern about possible genetic problems with the baby. It is also sometimes done near the end of pregnancy if an early delivery is required. In this case, it is done to help make sure the baby's lungs are mature enough for the baby to live outside of the womb. CHANGES OCCURING IN THE SECOND TRIMESTER OF PREGNANCY Your body goes through many changes during pregnancy. They vary from person to person. Talk to your caregiver about changes you notice that you are concerned about.  During the second trimester, you will likely have an increase in your appetite. It is normal to have cravings for certain foods. This varies from person to person and pregnancy to pregnancy.  Your lower abdomen will begin to bulge.  You may have to urinate more often because the uterus and baby are pressing on your bladder. It is also common to get more bladder infections during pregnancy (pain with urination). You can help this by drinking lots of fluids and emptying your bladder before and after intercourse.  You may begin to get stretch marks on your hips, abdomen, and breasts. These are normal changes in the body during pregnancy. There are no exercises or medications to take that prevent this change.  You may begin to develop swollen and bulging veins (varicose veins) in your legs. Wearing support hose, elevating your feet for 15 minutes, 3 to 4 times a day and limiting salt in your diet helps lessen the problem.  Heartburn may develop as the uterus grows and pushes up against the stomach. Antacids  recommended by your caregiver helps with this problem. Also, eating smaller meals 4 to 5 times a day helps.  Constipation can be treated with a stool softener or adding bulk to your diet. Drinking lots of fluids, vegetables, fruits, and whole grains are helpful.  Exercising is also helpful. If you have been very active up until your pregnancy, most of these activities can be continued during your pregnancy. If you have been less active, it is helpful to start an exercise program such as walking.  Hemorrhoids (varicose veins in the rectum) may develop at the end of the second trimester. Warm sitz baths and hemorrhoid cream recommended by your caregiver helps hemorrhoid problems.  Backaches may develop during this time of your pregnancy. Avoid heavy lifting, wear low heal shoes and practice good posture to help with backache problems.  Some pregnant women develop tingling and numbness of their hand and fingers because of swelling and tightening of ligaments in the wrist (carpel tunnel syndrome). This goes away after the baby is born.  As your breasts enlarge, you may have to get a bigger bra. Get a comfortable, cotton, support bra. Do not get a nursing bra until the last month of the pregnancy if you will be nursing the baby.  You may get a dark line from your belly button to the pubic area called the linea nigra.  You may develop rosy cheeks because of increase blood flow to the face.  You may develop spider looking lines of the face, neck, arms and chest. These go away after  the baby is born. HOME CARE INSTRUCTIONS   It is extremely important to avoid all smoking, herbs, alcohol, and unprescribed drugs during your pregnancy. These chemicals affect the formation and growth of the baby. Avoid these chemicals throughout the pregnancy to ensure the delivery of a healthy infant.  Most of your home care instructions are the same as suggested for the first trimester of your pregnancy. Keep your  caregiver's appointments. Follow your caregiver's instructions regarding medication use, exercise and diet.  During pregnancy, you are providing food for you and your baby. Continue to eat regular, well-balanced meals. Choose foods such as meat, fish, milk and other low fat dairy products, vegetables, fruits, and whole-grain breads and cereals. Your caregiver will tell you of the ideal weight gain.  A physical sexual relationship may be continued up until near the end of pregnancy if there are no other problems. Problems could include early (premature) leaking of amniotic fluid from the membranes, vaginal bleeding, abdominal pain, or other medical or pregnancy problems.  Exercise regularly if there are no restrictions. Check with your caregiver if you are unsure of the safety of some of your exercises. The greatest weight gain will occur in the last 2 trimesters of pregnancy. Exercise will help you:  Control your weight.  Get you in shape for labor and delivery.  Lose weight after you have the baby.  Wear a good support or jogging bra for breast tenderness during pregnancy. This may help if worn during sleep. Pads or tissues may be used in the bra if you are leaking colostrum.  Do not use hot tubs, steam rooms or saunas throughout the pregnancy.  Wear your seat belt at all times when driving. This protects you and your baby if you are in an accident.  Avoid raw meat, uncooked cheese, cat litter boxes and soil used by cats. These carry germs that can cause birth defects in the baby.  The second trimester is also a good time to visit your dentist for your dental health if this has not been done yet. Getting your teeth cleaned is OK. Use a soft toothbrush. Brush gently during pregnancy.  It is easier to loose urine during pregnancy. Tightening up and strengthening the pelvic muscles will help with this problem. Practice stopping your urination while you are going to the bathroom. These are the  same muscles you need to strengthen. It is also the muscles you would use as if you were trying to stop from passing gas. You can practice tightening these muscles up 10 times a set and repeating this about 3 times per day. Once you know what muscles to tighten up, do not perform these exercises during urination. It is more likely to contribute to an infection by backing up the urine.  Ask for help if you have financial, counseling or nutritional needs during pregnancy. Your caregiver will be able to offer counseling for these needs as well as refer you for other special needs.  Your skin may become oily. If so, wash your face with mild soap, use non-greasy moisturizer and oil or cream based makeup. MEDICATIONS AND DRUG USE IN PREGNANCY  Take prenatal vitamins as directed. The vitamin should contain 1 milligram of folic acid. Keep all vitamins out of reach of children. Only a couple vitamins or tablets containing iron may be fatal to a baby or young child when ingested.  Avoid use of all medications, including herbs, over-the-counter medications, not prescribed or suggested by your caregiver. Only take  over-the-counter or prescription medicines for pain, discomfort, or fever as directed by your caregiver. Do not use aspirin.  Let your caregiver also know about herbs you may be using.  Alcohol is related to a number of birth defects. This includes fetal alcohol syndrome. All alcohol, in any form, should be avoided completely. Smoking will cause low birth rate and premature babies.  Street or illegal drugs are very harmful to the baby. They are absolutely forbidden. A baby born to an addicted mother will be addicted at birth. The baby will go through the same withdrawal an adult does. SEEK MEDICAL CARE IF:  You have any concerns or worries during your pregnancy. It is better to call with your questions if you feel they cannot wait, rather than worry about them. SEEK IMMEDIATE MEDICAL CARE IF:   An  unexplained oral temperature above 100.4 F (38 C) develops, or as your caregiver suggests.  You have leaking of fluid from the vagina (birth canal). If leaking membranes are suspected, take your temperature and tell your caregiver of this when you call.  There is vaginal spotting, bleeding, or passing clots. Tell your caregiver of the amount and how many pads are used. Light spotting in pregnancy is common, especially following intercourse.  You develop a bad smelling vaginal discharge with a change in the color from clear to white.  You continue to feel sick to your stomach (nauseated) and have no relief from remedies suggested. You vomit blood or coffee ground-like materials.  You lose more than 2 pounds of weight or gain more than 2 pounds of weight over 1 week, or as suggested by your caregiver.  You notice swelling of your face, hands, feet, or legs.  You get exposed to [place] measles and have never had them.  You are exposed to fifth disease or chickenpox.  You develop belly (abdominal) pain. Round ligament discomfort is a common non-cancerous (benign) cause of abdominal pain in pregnancy. Your caregiver still must evaluate you.  You develop a bad headache that does not go away.  You develop fever, diarrhea, pain with urination, or shortness of breath.  You develop visual problems, blurry, or double vision.  You fall or are in a car accident or any kind of trauma.  There is mental or physical violence at home. Document Released: 12/24/2000 Document Revised: 03/24/2011 Document Reviewed: 06/28/2008 [facility] Patient Information 2013 [place], [place].

## 2012-04-22 NOTE — Progress Notes (Signed)
   Subjective:    RHYA SHAN is a [id number] [redacted]w[redacted]d being seen today for her first obstetrical visit.  Her obstetrical history is significant for late to care and history of preterm delivery. Patient does intend to breast feed. Pregnancy history fully reviewed.  Patient reports no complaints.  Filed Vitals:   04/22/12 1016  BP: 109/67  Temp: 97.7 F (36.5 C)  Weight: 165 lb 6.4 oz (75.025 kg)    HISTORY: OB History   Grav Para Term Preterm Abortions TAB SAB Ect Mult Living   4 3 1 2  0 0 0 0 0 3     # Outc Date GA Lbr Len/2nd Wgt Sex Del Anes PTL Lv   1 TRM 10/07 [redacted]w[redacted]d  5lb4oz(2.381kg) F SVD EPI  Yes   2 PRE 10/09 [redacted]w[redacted]d  5lb(2.268kg) M SVD EPI  Yes   Comments: Spontaneous labor   3 PRE 1/13 [redacted]w[redacted]d 24:08 / 00:26  M SVD EPI  Yes   4 CUR              Past Medical History  Diagnosis Date  . Asthma   . Scoliosis    Past Surgical History  Procedure Laterality Date  . No past surgeries     Family History  Problem Relation Age of Onset  . Diabetes Maternal Aunt      Exam    Uterus:     Pelvic Exam:    Perineum: Normal Perineum   Vulva: normal   Vagina:  normal mucosa, normal discharge   pH:    Cervix: 1/thick   Adnexa: not evaluated   Bony Pelvis: android  System: Breast:  normal appearance, no masses or tenderness   Skin: normal coloration and turgor, no rashes    Neurologic: oriented, no focal deficits   Extremities: normal strength, tone, and muscle mass   HEENT extra ocular movement intact   Mouth/Teeth mucous membranes moist, pharynx normal without lesions   Neck supple and no masses   Cardiovascular: regular rate and rhythm   Respiratory:  chest clear, no wheezing, crepitations, rhonchi, normal symmetric air entry   Abdomen: soft, gravid, NT   Urinary:       Assessment:    Pregnancy: [id number] Patient Active Problem List  Diagnosis  . Abnormal Pap smear of cervix  . History of pre-term labor  . Vaginal delivery  . Previous preterm delivery,  antepartum  . Supervision of high risk pregnancy in second trimester  . Insufficient prenatal care        Plan:     Initial labs drawn. Prenatal vitamins. Problem list reviewed and updated. Genetic Screening discussed : too late.  Ultrasound discussed; fetal survey: results reviewed. Discussed initiation of weekly injections of 17-P Will start vaginal prometrium until start of 17-P injections Patient desires BTL- will sig papers at next visit  Follow up in 2 weeks. 50% of 30 min visit spent on counseling and coordination of care.     Emberlyn Burlison 04/22/2012

## 2012-04-22 NOTE — Progress Notes (Signed)
Nutrition note: 1st visit consult Pt has gained 10.4# @ [redacted]w[redacted]d, which is wnl. Pt reports eating 3-4 meals & 3-4 snacks/d. Pt is taking a women's one-a-day vitamin (does not have adequate iron necessary while pregnant). Pt reports having N&V but no heartburn. Pt received verbal & written education on general nutrition during pregnancy. Encouraged PNV to consume recommended amount of iron. Disc tips to decrease N&V. Disc wt gain goals of 15-25# or 0.6#/wk.  Pt agrees to take appropriate PNV. Pt does not have WIC but plans to apply. Pt plans to BF. F/u if referred Blondell Reveal, MS, RD, LDN

## 2012-04-23 LAB — OBSTETRIC PANEL
Basophils Relative: 0 % (ref 0–1)
Eosinophils Absolute: 0.1 10*3/uL (ref 0.0–0.7)
Eosinophils Relative: 1 % (ref 0–5)
Lymphs Abs: 2.2 10*3/uL (ref 0.7–4.0)
MCH: 31.7 pg (ref 26.0–34.0)
MCHC: 33.6 g/dL (ref 30.0–36.0)
MCV: 94.2 fL (ref 78.0–100.0)
Monocytes Relative: 5 % (ref 3–12)
Platelets: 277 10*3/uL (ref 150–400)
RBC: 3.6 MIL/uL — ABNORMAL LOW (ref 3.87–5.11)
Rh Type: POSITIVE

## 2012-04-26 ENCOUNTER — Telehealth: Payer: Self-pay

## 2012-04-26 ENCOUNTER — Other Ambulatory Visit: Payer: Self-pay | Admitting: Obstetrics and Gynecology

## 2012-04-26 LAB — HEMOGLOBINOPATHY EVALUATION
Hgb A2 Quant: 2.6 % (ref 2.2–3.2)
Hgb A: 97.4 % (ref 96.8–97.8)

## 2012-04-26 MED ORDER — CEPHALEXIN 500 MG PO CAPS: 500.0000 mg | ORAL_CAPSULE | Freq: Four times a day (QID) | ORAL | Status: DC

## 2012-04-26 NOTE — Telephone Encounter (Signed)
Message copied by Faythe Casa on Mon Apr 26, 2012  4:13 PM ------      Message from: CONSTANT, PEGGY      Created: Mon Apr 26, 2012  3:27 PM       Please inform patient of UTI. Rx e-prescribed            Peggy ------

## 2012-04-26 NOTE — Telephone Encounter (Signed)
Called pt and informed pt that she has a UTI and that an Rx has been prescribed and sent to her pharmacy.  Pt stated understanding and had no further questions.

## 2012-04-29 ENCOUNTER — Ambulatory Visit (INDEPENDENT_AMBULATORY_CARE_PROVIDER_SITE_OTHER): Payer: Self-pay | Admitting: *Deleted

## 2012-04-29 VITALS — BP 100/60 | HR 82 | Wt 166.9 lb

## 2012-04-29 DIAGNOSIS — O09219 Supervision of pregnancy with history of pre-term labor, unspecified trimester: Secondary | ICD-10-CM

## 2012-04-29 DIAGNOSIS — Z8751 Personal history of pre-term labor: Secondary | ICD-10-CM

## 2012-04-29 MED ORDER — HYDROXYPROGESTERONE CAPROATE 250 MG/ML IM OIL
250.0000 mg | TOPICAL_OIL | Freq: Once | INTRAMUSCULAR | Status: AC
  Administered 2012-04-29: 250 mg via INTRAMUSCULAR

## 2012-05-01 ENCOUNTER — Encounter: Payer: Self-pay | Admitting: Obstetrics & Gynecology

## 2012-05-05 NOTE — MAU Provider Note (Signed)
History     CSN: [id number]  Arrival date & time 04/13/12  1034   First Provider Initiated Contact with Patient 04/13/12 1216      Chief Complaint  Patient presents with  . Abdominal Pain    HPI  Patient is a 26 yo female who presents to MAU today complaining of lower abdominal pain/pressure. Denies bleeding, fluid leaking, or discharge. Pt was not entirely sure she was pregnant prior to coming into MAU today but thought she might be as her last period started on October 5. Prior to that periods they were fairly regular. She was not on birth control at that time and has been intermittently sexually active since then. She has not had any prenatal care thus far. Has felt baby move beginning in March.   Endorses mild dysuria. Also complains of the side of her hip hurting and feeling numbness down into her leg. Occasionally has a headache, but no vision changes or lower extremity edema. Denies a hx of HTN or diabetes. No leg pain or rashes.  Does not have a regular doctor. Does not take any medications. No prior surgeries.  Has had three previous pregnancies, two of which were preterm deliveries. All of her children are doing well.  History of 2 preterm deliveries - 33 weeks (presented with contractions and bleeding) and 7.5 months (not sure of reason or presentation).  OB History   Grav Para Term Preterm Abortions TAB SAB Ect Mult Living   4 3 1 2 0 0 0 0 0 3      Past Medical History  Diagnosis Date  . Asthma   . Scoliosis     Past Surgical History  Procedure Laterality Date  . No past surgeries      Family History  Problem Relation Age of Onset  . Diabetes Maternal Aunt     History  Substance Use Topics  . Smoking status: Current Every Day Smoker -- 0.25 packs/day    Types: Cigarettes    Last Attempt to Quit: 08/29/2010  . Smokeless tobacco: Not on file  . Alcohol Use: No  Does endorse marijuana use, last time two days ago   Review of Systems  Constitutional:  Negative for fever and chills.  Genitourinary: Negative for urgency and vaginal bleeding.  Neurological: Negative for dizziness and headaches.    Allergies  Review of patient's allergies indicates no known allergies.  Home Medications   Current Outpatient Rx  Name  Route  Sig  Dispense  Refill  . Prenatal Vit-Fe Fumarate-FA (PRENATAL MULTIVITAMIN) TABS   Oral   Take 1 tablet by mouth daily at 12 noon.         . fluconazole (DIFLUCAN) 150 MG tablet   Oral   Take 1 tablet (150 mg total) by mouth once. Take in 5 days.   1 tablet   0     BP 114/84  Pulse 82  Temp(Src) 98.1 F (36.7 C) (Oral)  Resp 18  LMP 10/18/2011  Physical Exam Gen: NAD Heart: RRR Lungs: CTAB, NWOB Abd: gravid, uterus measures approximately 28 cm Neuro: nonfocal, speech intact GU: cervical exam from Dr. Adelbert Gaspard: closed, thick, high, normal external genitalia and vagina. Moderate white chunky and watery discharge.  Fern negative  MAU Course  Procedures (including critical care time)  Labs Reviewed  WET PREP, GENITAL - Abnormal; Notable for the following:    Yeast Wet Prep HPF POC FEW (*)    Trich, Wet Prep FEW (*)      Clue Cells Wet Prep HPF POC MODERATE (*)    WBC, Wet Prep HPF POC MODERATE (*)    All other components within normal limits  URINALYSIS, ROUTINE W REFLEX MICROSCOPIC - Abnormal; Notable for the following:    Leukocytes, UA SMALL (*)    All other components within normal limits  URINE RAPID DRUG SCREEN (HOSP PERFORMED) - Abnormal; Notable for the following:    Tetrahydrocannabinol POSITIVE (*)    All other components within normal limits  GC/CHLAMYDIA PROBE AMP  URINE CULTURE  URINE MICROSCOPIC-ADD ON     FHR:  140, moderate variability, accels present, occasional variable (appropriate for gestational age) TOCO:  No ctx, irritability  1. Trichimoniasis   2. BV (bacterial vaginosis)   3. Yeast vaginitis   4. History of pre-term labor     MDM  26 yo female G4P1203 who  presents at [redacted]w[redacted]d with chief complaint of pelvic pressure. Does not appear to be in labor now; cervix is closed thick and high per Dr. Jais Demir's exam. Limited OB u/s shows cervix to be 3.4 cm in length with normal amount of amniotic fluid.  Wet prep significant for trichomonas, yeast, and BV. Will treat with 2000 mg flagyl PO once now and diflucan 150mg once now, with an additional diflucan dose in 1 week if needed. Will send urine for culture. Pt and her partner were counseled that he needs to be treated as well to prevent reinfection with trichomonas.  Pt has had no prenatal care but will get established with [facility] for hx of preterm deliveries x 2. Pt would benefit from 17-P injections, will attempt to set this up for patient this coming week.  Pt discussed with and examined by Dr. Adean Milosevic as well.  Brittany J McIntyre, MD 04/13/12 1929  I saw and examined patient and agree with above resident note. I reviewed history, imaging, labs, and vitals. I personally reviewed the fetal heart tracing, and it is reactive and appropriate for gestational age. I spoke with Dr. Decker in MFM who suggested that there is some evidence that 17-P started up to 26 weeks could be beneficial in preventing preterm labor. The patient is agreeable to starting 17-P. However, we will get a complete OB sono on 04/15/12 to determine dating, as I suspect she may be over 26 weeks. She will be scheduled in [facility] due to history of preterm deliveries. Per clinic staff, the earliest we can get 17-P for her is Monday 04/19/12, which may be too late.  Jerrett Baldinger, MD  

## 2012-05-06 ENCOUNTER — Ambulatory Visit (INDEPENDENT_AMBULATORY_CARE_PROVIDER_SITE_OTHER): Payer: Self-pay | Admitting: Obstetrics & Gynecology

## 2012-05-06 ENCOUNTER — Other Ambulatory Visit: Payer: Self-pay | Admitting: Obstetrics & Gynecology

## 2012-05-06 VITALS — BP 118/72 | Temp 97.0°F | Wt 163.1 lb

## 2012-05-06 DIAGNOSIS — O09219 Supervision of pregnancy with history of pre-term labor, unspecified trimester: Secondary | ICD-10-CM

## 2012-05-06 DIAGNOSIS — O0930 Supervision of pregnancy with insufficient antenatal care, unspecified trimester: Secondary | ICD-10-CM

## 2012-05-06 DIAGNOSIS — O09299 Supervision of pregnancy with other poor reproductive or obstetric history, unspecified trimester: Secondary | ICD-10-CM

## 2012-05-06 DIAGNOSIS — O09213 Supervision of pregnancy with history of pre-term labor, third trimester: Secondary | ICD-10-CM

## 2012-05-06 DIAGNOSIS — O0933 Supervision of pregnancy with insufficient antenatal care, third trimester: Secondary | ICD-10-CM

## 2012-05-06 LAB — POCT URINALYSIS DIP (DEVICE)
Glucose, UA: NEGATIVE mg/dL
Nitrite: NEGATIVE
Specific Gravity, Urine: >=1.03 (ref 1.005–1.030)

## 2012-05-06 MED ORDER — HYDROXYPROGESTERONE CAPROATE 250 MG/ML IM OIL
250.0000 mg | TOPICAL_OIL | Freq: Once | INTRAMUSCULAR | Status: AC
  Administered 2012-05-06: 250 mg via INTRAMUSCULAR

## 2012-05-06 NOTE — Patient Instructions (Signed)
Return to clinic for any obstetric concerns or go to MAU for evaluation  

## 2012-05-06 NOTE — Progress Notes (Signed)
Pulse- 89 Pt c/o "stomach burning"

## 2012-05-06 NOTE — Progress Notes (Signed)
Continue weekly 17P.  No other complaints or concerns.  Fetal movement and labor precautions reviewed.

## 2012-05-13 ENCOUNTER — Ambulatory Visit (INDEPENDENT_AMBULATORY_CARE_PROVIDER_SITE_OTHER): Payer: Self-pay | Admitting: *Deleted

## 2012-05-13 VITALS — BP 121/60 | HR 68 | Wt 165.1 lb

## 2012-05-13 DIAGNOSIS — O09219 Supervision of pregnancy with history of pre-term labor, unspecified trimester: Secondary | ICD-10-CM

## 2012-05-13 DIAGNOSIS — Z8751 Personal history of pre-term labor: Secondary | ICD-10-CM

## 2012-05-20 ENCOUNTER — Encounter: Payer: Self-pay | Admitting: Obstetrics and Gynecology

## 2012-05-20 ENCOUNTER — Ambulatory Visit (INDEPENDENT_AMBULATORY_CARE_PROVIDER_SITE_OTHER): Payer: Medicaid Other | Admitting: Obstetrics and Gynecology

## 2012-05-20 ENCOUNTER — Other Ambulatory Visit: Payer: Self-pay | Admitting: Obstetrics and Gynecology

## 2012-05-20 VITALS — BP 112/75 | Temp 97.4°F | Wt 163.2 lb

## 2012-05-20 DIAGNOSIS — R87619 Unspecified abnormal cytological findings in specimens from cervix uteri: Secondary | ICD-10-CM

## 2012-05-20 DIAGNOSIS — O0992 Supervision of high risk pregnancy, unspecified, second trimester: Secondary | ICD-10-CM

## 2012-05-20 DIAGNOSIS — O0930 Supervision of pregnancy with insufficient antenatal care, unspecified trimester: Secondary | ICD-10-CM

## 2012-05-20 DIAGNOSIS — O09213 Supervision of pregnancy with history of pre-term labor, third trimester: Secondary | ICD-10-CM

## 2012-05-20 DIAGNOSIS — O0933 Supervision of pregnancy with insufficient antenatal care, third trimester: Secondary | ICD-10-CM

## 2012-05-20 DIAGNOSIS — Z302 Encounter for sterilization: Secondary | ICD-10-CM

## 2012-05-20 DIAGNOSIS — O09299 Supervision of pregnancy with other poor reproductive or obstetric history, unspecified trimester: Secondary | ICD-10-CM

## 2012-05-20 LAB — POCT URINALYSIS DIP (DEVICE)
Bilirubin Urine: NEGATIVE
Hgb urine dipstick: NEGATIVE
Ketones, ur: NEGATIVE mg/dL
Leukocytes, UA: NEGATIVE
Specific Gravity, Urine: >=1.03 (ref 1.005–1.030)
pH: 6 (ref 5.0–8.0)

## 2012-05-20 NOTE — Progress Notes (Signed)
Patient doing well without complaints. No need to start prometrium since started on 17-P and CL 4.1 cm on anatomy scan. Patient will start keflex for UTI. FM/PTL precautions reviewed. Continue weekly 17-P

## 2012-05-20 NOTE — Progress Notes (Signed)
P=113, States has never started taking prometrium because didn't know what it was for. Has not started keflex yet either for uti.

## 2012-05-27 ENCOUNTER — Ambulatory Visit (INDEPENDENT_AMBULATORY_CARE_PROVIDER_SITE_OTHER): Payer: Medicaid Other

## 2012-05-27 VITALS — BP 99/65 | HR 77 | Temp 98.0°F | Ht 68.0 in | Wt 165.8 lb

## 2012-05-27 DIAGNOSIS — O09219 Supervision of pregnancy with history of pre-term labor, unspecified trimester: Secondary | ICD-10-CM

## 2012-06-03 ENCOUNTER — Encounter: Payer: Self-pay | Admitting: Obstetrics and Gynecology

## 2012-06-03 ENCOUNTER — Ambulatory Visit (INDEPENDENT_AMBULATORY_CARE_PROVIDER_SITE_OTHER): Payer: Medicaid Other | Admitting: Obstetrics and Gynecology

## 2012-06-03 VITALS — BP 98/66 | Temp 98.8°F | Wt 163.8 lb

## 2012-06-03 DIAGNOSIS — O09213 Supervision of pregnancy with history of pre-term labor, third trimester: Secondary | ICD-10-CM

## 2012-06-03 DIAGNOSIS — O0992 Supervision of high risk pregnancy, unspecified, second trimester: Secondary | ICD-10-CM

## 2012-06-03 DIAGNOSIS — R87619 Unspecified abnormal cytological findings in specimens from cervix uteri: Secondary | ICD-10-CM

## 2012-06-03 DIAGNOSIS — Z8751 Personal history of pre-term labor: Secondary | ICD-10-CM

## 2012-06-03 DIAGNOSIS — O09299 Supervision of pregnancy with other poor reproductive or obstetric history, unspecified trimester: Secondary | ICD-10-CM

## 2012-06-03 DIAGNOSIS — Z302 Encounter for sterilization: Secondary | ICD-10-CM

## 2012-06-03 DIAGNOSIS — O0933 Supervision of pregnancy with insufficient antenatal care, third trimester: Secondary | ICD-10-CM

## 2012-06-03 DIAGNOSIS — O0930 Supervision of pregnancy with insufficient antenatal care, unspecified trimester: Secondary | ICD-10-CM

## 2012-06-03 LAB — POCT URINALYSIS DIP (DEVICE)
Ketones, ur: 40 mg/dL — AB
Protein, ur: 30 mg/dL — AB
Specific Gravity, Urine: 1.025 (ref 1.005–1.030)

## 2012-06-03 MED ORDER — PROMETHAZINE HCL 25 MG PO TABS: 25.0000 mg | ORAL_TABLET | Freq: Four times a day (QID) | ORAL | Status: DC | PRN

## 2012-06-03 NOTE — Progress Notes (Signed)
P=84, States stopped taking keflex after about 4 days because everytime she took them she threw it back up.

## 2012-06-03 NOTE — Progress Notes (Addendum)
Nutrition note: f/u visit Pt has h/o wt loss. When RD saw pt 04/22/12, we had her ht incorrect so her wt was plotted incorrectly (at that point her wt gain was inadequate as well). Pt has gained 8.8# @ [redacted]w[redacted]d, which is < expected & pt has lost 1.6# since 04/22/12. Pt reports eating 3 meals & 2-3 snacks/d. Pt reports still having some nausea in the morning. (Dr wrote rx for phenergan) Pt is taking PNV. Discussed wt gain goals of 25-35# or 1#/wk.  Provided handout "Gain more weight during pregnancy" and encouraged pt to consume energy dense snacks with protein sources. Discussed tips to decrease nausea. Pt agrees to continue taking PNV & try to add energy dense snacks. F/u in 2-4 wks Blondell Reveal, MS, RD, LDN

## 2012-06-03 NOTE — Progress Notes (Signed)
Patient doing well and is without complaints. She desires to be checked just for peace of mid as she delivered at this gestational age. Cervical exam unchanged since initiation of care. Continue weekly 17-P. Patient reports persistent nausea and emesis at time. Rx phenergan provided. Patient advised to complete antibiotic course with the aid of the phenergan.

## 2012-06-03 NOTE — Addendum Note (Signed)
Addended by: Catalina Antigua on: 06/03/2012 11:10 AM   Modules accepted: Orders

## 2012-06-10 ENCOUNTER — Ambulatory Visit (HOSPITAL_COMMUNITY)
Admission: RE | Admit: 2012-06-10 | Discharge: 2012-06-10 | Disposition: A | Discharge status: Home / Self Care | Payer: Medicaid Other | Source: Ambulatory Visit | Attending: Obstetrics and Gynecology | Admitting: Obstetrics and Gynecology

## 2012-06-10 ENCOUNTER — Ambulatory Visit (INDEPENDENT_AMBULATORY_CARE_PROVIDER_SITE_OTHER): Payer: Medicaid Other | Admitting: General Practice

## 2012-06-10 VITALS — BP 118/72 | HR 89 | Temp 97.6°F | Ht 66.0 in | Wt 163.2 lb

## 2012-06-10 DIAGNOSIS — O0992 Supervision of high risk pregnancy, unspecified, second trimester: Secondary | ICD-10-CM

## 2012-06-10 DIAGNOSIS — O365990 Maternal care for other known or suspected poor fetal growth, unspecified trimester, not applicable or unspecified: Secondary | ICD-10-CM | POA: Insufficient documentation

## 2012-06-10 DIAGNOSIS — Z3689 Encounter for other specified antenatal screening: Secondary | ICD-10-CM | POA: Insufficient documentation

## 2012-06-10 DIAGNOSIS — O09219 Supervision of pregnancy with history of pre-term labor, unspecified trimester: Secondary | ICD-10-CM

## 2012-06-10 DIAGNOSIS — O09213 Supervision of pregnancy with history of pre-term labor, third trimester: Secondary | ICD-10-CM

## 2012-06-15 ENCOUNTER — Encounter: Payer: Self-pay | Admitting: *Deleted

## 2012-06-17 ENCOUNTER — Ambulatory Visit (INDEPENDENT_AMBULATORY_CARE_PROVIDER_SITE_OTHER): Payer: Medicaid Other | Admitting: Obstetrics and Gynecology

## 2012-06-17 ENCOUNTER — Encounter: Payer: Self-pay | Admitting: Obstetrics and Gynecology

## 2012-06-17 VITALS — BP 100/70 | Wt 164.5 lb

## 2012-06-17 DIAGNOSIS — O0992 Supervision of high risk pregnancy, unspecified, second trimester: Secondary | ICD-10-CM

## 2012-06-17 DIAGNOSIS — O09219 Supervision of pregnancy with history of pre-term labor, unspecified trimester: Secondary | ICD-10-CM

## 2012-06-17 DIAGNOSIS — Z302 Encounter for sterilization: Secondary | ICD-10-CM

## 2012-06-17 DIAGNOSIS — O0930 Supervision of pregnancy with insufficient antenatal care, unspecified trimester: Secondary | ICD-10-CM

## 2012-06-17 DIAGNOSIS — O0933 Supervision of pregnancy with insufficient antenatal care, third trimester: Secondary | ICD-10-CM

## 2012-06-17 DIAGNOSIS — Z8751 Personal history of pre-term labor: Secondary | ICD-10-CM

## 2012-06-17 LAB — POCT URINALYSIS DIP (DEVICE)
Ketones, ur: NEGATIVE mg/dL
Protein, ur: 30 mg/dL — AB
Urobilinogen, UA: 1 mg/dL (ref 0.0–1.0)
pH: 7 (ref 5.0–8.0)

## 2012-06-17 NOTE — Progress Notes (Signed)
EFW 1507 gm (47%tile). Patient doing well without complaints. Continue weekly 17-p

## 2012-06-17 NOTE — Progress Notes (Signed)
Pulse: 97 Pts dating changed on ultrasound.

## 2012-06-21 ENCOUNTER — Other Ambulatory Visit: Payer: Self-pay | Admitting: Obstetrics and Gynecology

## 2012-06-21 ENCOUNTER — Telehealth: Payer: Self-pay | Admitting: *Deleted

## 2012-06-21 MED ORDER — CEPHALEXIN 500 MG PO CAPS: 500.0000 mg | ORAL_CAPSULE | Freq: Four times a day (QID) | ORAL | Status: DC

## 2012-06-21 NOTE — Telephone Encounter (Signed)
Message copied by Mannie Stabile on Mon Jun 21, 2012  5:09 PM ------      Message from: CONSTANT, PEGGY      Created: Mon Jun 21, 2012  1:06 PM       Please inform patient of UTI Rx Keflex e-prescribed            Thanks            Peggy ------

## 2012-06-21 NOTE — Telephone Encounter (Signed)
Called patient and heard message that voicemail is not set up.  

## 2012-06-24 ENCOUNTER — Ambulatory Visit (INDEPENDENT_AMBULATORY_CARE_PROVIDER_SITE_OTHER): Payer: Medicaid Other

## 2012-06-24 VITALS — BP 120/70 | HR 91 | Temp 97.3°F | Wt 169.4 lb

## 2012-06-24 DIAGNOSIS — Z8751 Personal history of pre-term labor: Secondary | ICD-10-CM

## 2012-06-24 DIAGNOSIS — O09219 Supervision of pregnancy with history of pre-term labor, unspecified trimester: Secondary | ICD-10-CM

## 2012-06-24 NOTE — Telephone Encounter (Signed)
Called patient, no answer- left message stating we are calling to inform her that from her most recent urine culture it showed a UTI and we have called in an antibiotic to treat this to her [facility] and if she has any other questions or concerns to call us back at the clinics

## 2012-07-01 ENCOUNTER — Ambulatory Visit (INDEPENDENT_AMBULATORY_CARE_PROVIDER_SITE_OTHER): Payer: Medicaid Other | Admitting: Obstetrics & Gynecology

## 2012-07-01 ENCOUNTER — Encounter: Payer: Self-pay | Admitting: *Deleted

## 2012-07-01 VITALS — BP 105/71 | Temp 98.2°F | Wt 169.1 lb

## 2012-07-01 DIAGNOSIS — R87619 Unspecified abnormal cytological findings in specimens from cervix uteri: Secondary | ICD-10-CM

## 2012-07-01 DIAGNOSIS — O4700 False labor before 37 completed weeks of gestation, unspecified trimester: Secondary | ICD-10-CM

## 2012-07-01 LAB — POCT URINALYSIS DIP (DEVICE)
Bilirubin Urine: NEGATIVE
Hgb urine dipstick: NEGATIVE
Nitrite: NEGATIVE
Protein, ur: 30 mg/dL — AB
pH: 6.5 (ref 5.0–8.0)

## 2012-07-01 MED ORDER — BETAMETHASONE SOD PHOS & ACET 6 (3-3) MG/ML IJ SUSP
12.0000 mg | Freq: Once | INTRAMUSCULAR | Status: AC
  Administered 2012-07-01: 12 mg via INTRAMUSCULAR

## 2012-07-01 NOTE — Progress Notes (Signed)
Pulse- 87  Pain-"when baby balls up"

## 2012-07-01 NOTE — Progress Notes (Signed)
Pt having contraction 3-4 per day.  Pt has history of pretem birth x2.  On 17-P.  Cervix 3-4/50/-1.  Midposition, soft.  FFN sent.  Will give betamethasone as outpt.  PTL precautions given (pt not having contractions now).  Wants BTL papers signed today.

## 2012-07-02 ENCOUNTER — Ambulatory Visit (INDEPENDENT_AMBULATORY_CARE_PROVIDER_SITE_OTHER): Payer: Medicaid Other | Admitting: Obstetrics and Gynecology

## 2012-07-02 DIAGNOSIS — O4700 False labor before 37 completed weeks of gestation, unspecified trimester: Secondary | ICD-10-CM

## 2012-07-02 MED ORDER — BETAMETHASONE SOD PHOS & ACET 6 (3-3) MG/ML IJ SUSP
12.0000 mg | Freq: Once | INTRAMUSCULAR | Status: AC
  Administered 2012-07-02: 12 mg via INTRAMUSCULAR

## 2012-07-08 ENCOUNTER — Ambulatory Visit: Payer: Medicaid Other

## 2012-07-09 ENCOUNTER — Ambulatory Visit (INDEPENDENT_AMBULATORY_CARE_PROVIDER_SITE_OTHER): Payer: Medicaid Other | Admitting: General Practice

## 2012-07-09 VITALS — BP 109/76 | HR 70 | Temp 98.4°F | Ht 68.0 in | Wt 174.2 lb

## 2012-07-09 DIAGNOSIS — O09213 Supervision of pregnancy with history of pre-term labor, third trimester: Secondary | ICD-10-CM

## 2012-07-09 DIAGNOSIS — O09219 Supervision of pregnancy with history of pre-term labor, unspecified trimester: Secondary | ICD-10-CM

## 2012-07-15 ENCOUNTER — Ambulatory Visit (INDEPENDENT_AMBULATORY_CARE_PROVIDER_SITE_OTHER): Payer: Medicaid Other | Admitting: Advanced Practice Midwife

## 2012-07-15 VITALS — BP 120/81 | Temp 97.8°F | Wt 170.3 lb

## 2012-07-15 DIAGNOSIS — O09219 Supervision of pregnancy with history of pre-term labor, unspecified trimester: Secondary | ICD-10-CM

## 2012-07-15 DIAGNOSIS — O09213 Supervision of pregnancy with history of pre-term labor, third trimester: Secondary | ICD-10-CM

## 2012-07-15 LAB — POCT URINALYSIS DIP (DEVICE)
Bilirubin Urine: NEGATIVE
Hgb urine dipstick: NEGATIVE
Ketones, ur: NEGATIVE mg/dL
Nitrite: NEGATIVE
pH: 7 (ref 5.0–8.0)

## 2012-07-15 NOTE — Progress Notes (Signed)
P=96,

## 2012-07-15 NOTE — Progress Notes (Signed)
No change in UC's. Nutrient-rich, foods discussed.

## 2012-07-22 ENCOUNTER — Ambulatory Visit (INDEPENDENT_AMBULATORY_CARE_PROVIDER_SITE_OTHER): Payer: Medicaid Other | Admitting: Family

## 2012-07-22 VITALS — BP 108/66 | Temp 97.3°F | Wt 174.0 lb

## 2012-07-22 DIAGNOSIS — O0933 Supervision of pregnancy with insufficient antenatal care, third trimester: Secondary | ICD-10-CM

## 2012-07-22 DIAGNOSIS — Z302 Encounter for sterilization: Secondary | ICD-10-CM

## 2012-07-22 DIAGNOSIS — O0993 Supervision of high risk pregnancy, unspecified, third trimester: Secondary | ICD-10-CM

## 2012-07-22 DIAGNOSIS — O0930 Supervision of pregnancy with insufficient antenatal care, unspecified trimester: Secondary | ICD-10-CM

## 2012-07-22 DIAGNOSIS — O09219 Supervision of pregnancy with history of pre-term labor, unspecified trimester: Secondary | ICD-10-CM

## 2012-07-22 LAB — POCT URINALYSIS DIP (DEVICE)
Glucose, UA: NEGATIVE mg/dL
Nitrite: NEGATIVE
Urobilinogen, UA: 1 mg/dL (ref 0.0–1.0)

## 2012-07-22 MED ORDER — NITROFURANTOIN MONOHYD MACRO 100 MG PO CAPS: 100.0000 mg | ORAL_CAPSULE | Freq: Two times a day (BID) | ORAL | Status: DC

## 2012-07-22 NOTE — Progress Notes (Signed)
Pulse: 74

## 2012-07-22 NOTE — Progress Notes (Signed)
No questions or concerns; GBS and GC/CT collected. Moderate leuk in urine > RX Macrobid, urine culture sent.

## 2012-07-23 LAB — CULTURE, OB URINE: Organism ID, Bacteria: NO GROWTH

## 2012-07-24 LAB — OB RESULTS CONSOLE GC/CHLAMYDIA: Chlamydia: NEGATIVE

## 2012-07-24 LAB — GC/CHLAMYDIA PROBE AMP: CT Probe RNA: NEGATIVE

## 2012-07-25 LAB — CULTURE, BETA STREP (GROUP B ONLY)

## 2012-07-26 ENCOUNTER — Encounter: Payer: Self-pay | Admitting: Family

## 2012-07-28 ENCOUNTER — Inpatient Hospital Stay (HOSPITAL_COMMUNITY)
Admission: AD | Admit: 2012-07-28 | Discharge: 2012-07-30 | DRG: 775 | Disposition: A | Discharge status: Home / Self Care | Payer: Medicaid Other | Source: Ambulatory Visit | Attending: Family Medicine | Admitting: Family Medicine

## 2012-07-28 ENCOUNTER — Encounter (HOSPITAL_COMMUNITY): Payer: Self-pay | Admitting: Anesthesiology

## 2012-07-28 ENCOUNTER — Encounter (HOSPITAL_COMMUNITY): Payer: Self-pay | Admitting: *Deleted

## 2012-07-28 ENCOUNTER — Encounter (HOSPITAL_COMMUNITY): Payer: Self-pay

## 2012-07-28 ENCOUNTER — Inpatient Hospital Stay (HOSPITAL_COMMUNITY): Payer: Medicaid Other | Admitting: Anesthesiology

## 2012-07-28 ENCOUNTER — Inpatient Hospital Stay (HOSPITAL_COMMUNITY)
Admission: AD | Admit: 2012-07-28 | Discharge: 2012-07-28 | Disposition: A | Discharge status: Home / Self Care | Payer: Medicaid Other | Source: Ambulatory Visit | Attending: Family Medicine | Admitting: Family Medicine

## 2012-07-28 DIAGNOSIS — Z302 Encounter for sterilization: Secondary | ICD-10-CM

## 2012-07-28 DIAGNOSIS — O0933 Supervision of pregnancy with insufficient antenatal care, third trimester: Secondary | ICD-10-CM

## 2012-07-28 DIAGNOSIS — O09212 Supervision of pregnancy with history of pre-term labor, second trimester: Secondary | ICD-10-CM

## 2012-07-28 DIAGNOSIS — O0993 Supervision of high risk pregnancy, unspecified, third trimester: Secondary | ICD-10-CM

## 2012-07-28 DIAGNOSIS — IMO0001 Reserved for inherently not codable concepts without codable children: Secondary | ICD-10-CM

## 2012-07-28 DIAGNOSIS — O479 False labor, unspecified: Secondary | ICD-10-CM | POA: Insufficient documentation

## 2012-07-28 LAB — CBC
HCT: 36 % (ref 36.0–46.0)
Hemoglobin: 12.4 g/dL (ref 12.0–15.0)
MCH: 32.6 pg (ref 26.0–34.0)
MCV: 94.7 fL (ref 78.0–100.0)
RBC: 3.8 MIL/uL — ABNORMAL LOW (ref 3.87–5.11)
WBC: 9.8 10*3/uL (ref 4.0–10.5)

## 2012-07-28 MED ORDER — BENZOCAINE-MENTHOL 20-0.5 % EX AERO
1.0000 "application " | INHALATION_SPRAY | CUTANEOUS | Status: DC | PRN
  Administered 2012-07-28: 1 via TOPICAL
  Filled 2012-07-28: qty 56

## 2012-07-28 MED ORDER — LACTATED RINGERS IV SOLN
500.0000 mL | Freq: Once | INTRAVENOUS | Status: DC
  Administered 2012-07-28: 500 mL via INTRAVENOUS

## 2012-07-28 MED ORDER — PHENYLEPHRINE 40 MCG/ML (10ML) SYRINGE FOR IV PUSH (FOR BLOOD PRESSURE SUPPORT)
80.0000 ug | PREFILLED_SYRINGE | INTRAVENOUS | Status: DC | PRN
  Filled 2012-07-28: qty 2
  Filled 2012-07-28: qty 5

## 2012-07-28 MED ORDER — TETANUS-DIPHTH-ACELL PERTUSSIS 5-2.5-18.5 LF-MCG/0.5 IM SUSP: 0.5000 mL | Freq: Once | INTRAMUSCULAR | Status: DC

## 2012-07-28 MED ORDER — LIDOCAINE HCL (PF) 1 % IJ SOLN
30.0000 mL | INTRAMUSCULAR | Status: DC | PRN
  Filled 2012-07-28 (×2): qty 30

## 2012-07-28 MED ORDER — OXYTOCIN BOLUS FROM INFUSION
500.0000 mL | INTRAVENOUS | Status: DC
  Administered 2012-07-28: 500 mL via INTRAVENOUS

## 2012-07-28 MED ORDER — NALBUPHINE SYRINGE 5 MG/0.5 ML
5.0000 mg | INJECTION | INTRAMUSCULAR | Status: DC | PRN
Start: 2012-07-28 — End: 2012-07-28
  Administered 2012-07-28: 5 mg via INTRAVENOUS
  Filled 2012-07-28: qty 1
  Filled 2012-07-28: qty 0.5

## 2012-07-28 MED ORDER — PHENYLEPHRINE 40 MCG/ML (10ML) SYRINGE FOR IV PUSH (FOR BLOOD PRESSURE SUPPORT): 80.0000 ug | PREFILLED_SYRINGE | INTRAVENOUS | Status: DC | PRN

## 2012-07-28 MED ORDER — SENNOSIDES-DOCUSATE SODIUM 8.6-50 MG PO TABS
2.0000 | ORAL_TABLET | Freq: Every day | ORAL | Status: DC
  Administered 2012-07-28: 2 via ORAL

## 2012-07-28 MED ORDER — FLEET ENEMA 7-19 GM/118ML RE ENEM: 1.0000 | ENEMA | RECTAL | Status: DC | PRN

## 2012-07-28 MED ORDER — ACETAMINOPHEN 325 MG PO TABS: 650.0000 mg | ORAL_TABLET | ORAL | Status: DC | PRN

## 2012-07-28 MED ORDER — DIPHENHYDRAMINE HCL 50 MG/ML IJ SOLN: 12.5000 mg | INTRAMUSCULAR | Status: DC | PRN

## 2012-07-28 MED ORDER — IBUPROFEN 600 MG PO TABS: 600.0000 mg | ORAL_TABLET | Freq: Four times a day (QID) | ORAL | Status: DC | PRN

## 2012-07-28 MED ORDER — PNEUMOCOCCAL VAC POLYVALENT 25 MCG/0.5ML IJ INJ
0.5000 mL | INJECTION | INTRAMUSCULAR | Status: AC
  Administered 2012-07-29: 0.5 mL via INTRAMUSCULAR
  Filled 2012-07-28 (×2): qty 0.5

## 2012-07-28 MED ORDER — OXYTOCIN 40 UNITS IN LACTATED RINGERS INFUSION - SIMPLE MED
62.5000 mL/h | INTRAVENOUS | Status: DC
  Filled 2012-07-28: qty 1000

## 2012-07-28 MED ORDER — CITRIC ACID-SODIUM CITRATE 334-500 MG/5ML PO SOLN: 30.0000 mL | ORAL | Status: DC | PRN

## 2012-07-28 MED ORDER — PRENATAL MULTIVITAMIN CH
1.0000 | ORAL_TABLET | Freq: Every day | ORAL | Status: DC
  Administered 2012-07-29: 1 via ORAL
  Filled 2012-07-28: qty 1

## 2012-07-28 MED ORDER — WITCH HAZEL-GLYCERIN EX PADS: 1.0000 "application " | MEDICATED_PAD | CUTANEOUS | Status: DC | PRN

## 2012-07-28 MED ORDER — OXYCODONE-ACETAMINOPHEN 5-325 MG PO TABS
1.0000 | ORAL_TABLET | ORAL | Status: DC | PRN
  Administered 2012-07-28: 2 via ORAL
  Administered 2012-07-28 – 2012-07-29 (×2): 1 via ORAL
  Administered 2012-07-29 (×2): 2 via ORAL
  Administered 2012-07-30: 1 via ORAL
  Filled 2012-07-28: qty 2
  Filled 2012-07-28: qty 1
  Filled 2012-07-28 (×4): qty 2
  Filled 2012-07-28: qty 1

## 2012-07-28 MED ORDER — FENTANYL 2.5 MCG/ML BUPIVACAINE 1/10 % EPIDURAL INFUSION (WH - ANES): 14.0000 mL/h | INTRAMUSCULAR | Status: DC | PRN

## 2012-07-28 MED ORDER — OXYCODONE-ACETAMINOPHEN 5-325 MG PO TABS: 1.0000 | ORAL_TABLET | ORAL | Status: DC | PRN

## 2012-07-28 MED ORDER — LACTATED RINGERS IV SOLN
INTRAVENOUS | Status: DC
  Administered 2012-07-28: 07:00:00 via INTRAVENOUS

## 2012-07-28 MED ORDER — EPHEDRINE 5 MG/ML INJ: 10.0000 mg | INTRAVENOUS | Status: DC | PRN

## 2012-07-28 MED ORDER — ONDANSETRON HCL 4 MG/2ML IJ SOLN: 4.0000 mg | INTRAMUSCULAR | Status: DC | PRN

## 2012-07-28 MED ORDER — IBUPROFEN 600 MG PO TABS
600.0000 mg | ORAL_TABLET | Freq: Four times a day (QID) | ORAL | Status: DC
  Administered 2012-07-28 – 2012-07-30 (×6): 600 mg via ORAL
  Filled 2012-07-28 (×7): qty 1

## 2012-07-28 MED ORDER — LACTATED RINGERS IV SOLN: 500.0000 mL | Freq: Once | INTRAVENOUS | Status: DC

## 2012-07-28 MED ORDER — ONDANSETRON HCL 4 MG PO TABS: 4.0000 mg | ORAL_TABLET | ORAL | Status: DC | PRN

## 2012-07-28 MED ORDER — LACTATED RINGERS IV SOLN: 500.0000 mL | INTRAVENOUS | Status: DC | PRN

## 2012-07-28 MED ORDER — MISOPROSTOL 200 MCG PO TABS
800.0000 ug | ORAL_TABLET | Freq: Once | ORAL | Status: AC
  Administered 2012-07-28: 800 ug via RECTAL

## 2012-07-28 MED ORDER — DIPHENHYDRAMINE HCL 25 MG PO CAPS: 25.0000 mg | ORAL_CAPSULE | Freq: Four times a day (QID) | ORAL | Status: DC | PRN

## 2012-07-28 MED ORDER — MISOPROSTOL 200 MCG PO TABS
ORAL_TABLET | ORAL | Status: AC
  Filled 2012-07-28: qty 4

## 2012-07-28 MED ORDER — DIBUCAINE 1 % RE OINT: 1.0000 "application " | TOPICAL_OINTMENT | RECTAL | Status: DC | PRN

## 2012-07-28 MED ORDER — SIMETHICONE 80 MG PO CHEW: 80.0000 mg | CHEWABLE_TABLET | ORAL | Status: DC | PRN

## 2012-07-28 MED ORDER — ONDANSETRON HCL 4 MG/2ML IJ SOLN: 4.0000 mg | Freq: Four times a day (QID) | INTRAMUSCULAR | Status: DC | PRN

## 2012-07-28 MED ORDER — EPHEDRINE 5 MG/ML INJ
10.0000 mg | INTRAVENOUS | Status: DC | PRN
  Filled 2012-07-28: qty 4
  Filled 2012-07-28: qty 2

## 2012-07-28 MED ORDER — PHENYLEPHRINE 40 MCG/ML (10ML) SYRINGE FOR IV PUSH (FOR BLOOD PRESSURE SUPPORT)
80.0000 ug | PREFILLED_SYRINGE | INTRAVENOUS | Status: DC | PRN
  Filled 2012-07-28: qty 2

## 2012-07-28 MED ORDER — LANOLIN HYDROUS EX OINT: TOPICAL_OINTMENT | CUTANEOUS | Status: DC | PRN

## 2012-07-28 MED ORDER — FENTANYL 2.5 MCG/ML BUPIVACAINE 1/10 % EPIDURAL INFUSION (WH - ANES)
14.0000 mL/h | INTRAMUSCULAR | Status: DC | PRN
  Filled 2012-07-28: qty 125

## 2012-07-28 MED ORDER — EPHEDRINE 5 MG/ML INJ
10.0000 mg | INTRAVENOUS | Status: DC | PRN
  Filled 2012-07-28: qty 2

## 2012-07-28 MED ORDER — ZOLPIDEM TARTRATE 5 MG PO TABS: 5.0000 mg | ORAL_TABLET | Freq: Every evening | ORAL | Status: DC | PRN

## 2012-07-28 NOTE — MAU Note (Signed)
Contractions worsening.  

## 2012-07-28 NOTE — MAU Note (Signed)
contractions 

## 2012-07-28 NOTE — H&P (Signed)
Chart reviewed and agree with management and plan.  

## 2012-07-28 NOTE — Anesthesia Preprocedure Evaluation (Signed)

## 2012-07-28 NOTE — Progress Notes (Signed)
   Subjective: Reports comfortable with epidural.  No questions or concerns.  Objective: BP 111/89  Pulse 112  Temp(Src) 98.1 F (36.7 C) (Oral)  Resp 18  Ht 5\' 7"  (1.702 m)  Wt 78.472 kg (173 lb)  BMI 27.09 kg/m2  SpO2 100%  LMP 10/18/2011      FHT:  FHR: 120's bpm, variability: moderate,  accelerations:  Present,  decelerations:  Present intermittent variable decels UC:   regular, every 3-5 minutes SVE:   Dilation: 7.5 Effacement (%): 90 Station: -1 Exam by:: Isac Sarna, RN  Labs: Lab Results  Component Value Date   WBC 9.8 07/28/2012   HGB 12.4 07/28/2012   HCT 36.0 07/28/2012   MCV 94.7 07/28/2012   PLT 217 07/28/2012    Assessment / Plan: Spontaneous labor, progressing normally  Labor: Progressing normally Preeclampsia:  n/a Fetal Wellbeing:  Category II Pain Control:  Epidural I/D:  GBS neg Anticipated MOD:  NSVD  MUHAMMAD,Shyvonne Chastang 07/28/2012, 10:00 AM

## 2012-07-28 NOTE — H&P (Signed)
HPI: Candace Booker is a 27 y.o. year old G25P1203 female at [redacted]w[redacted]d weeks gestation by 22 week sUS who presents to MAU reporting Labor. Pregnancy complicated by Hx PTD x 2 and late [facility]. On 17-P this pregnancy. She received care at the [facility]. Denies LOF or VB. Good FM.   History OB History   Grav Para Term Preterm Abortions TAB SAB Ect Mult Living   4 3 1 2  0 0 0 0 0 3     Past Medical History  Diagnosis Date  . Asthma   . Scoliosis   . Preterm labor    Past Surgical History  Procedure Laterality Date  . No past surgeries     Family History: family history includes Diabetes in her maternal aunt. Social History:  reports that she has been smoking Cigarettes.  She has been smoking about 0.25 packs per day. She does not have any smokeless tobacco history on file. She reports that she uses illicit drugs (Marijuana). She reports that she does not drink alcohol.   Prenatal Transfer Tool  Maternal Diabetes: No Genetic Screening: Declined Maternal Ultrasounds/Referrals: Normal Fetal Ultrasounds or other Referrals:  None Maternal Substance Abuse:  Yes:  Type: Smoker, Marijuana Significant Maternal Medications:  None Significant Maternal Lab Results:  Lab values include: Group B Strep negative Other Comments:  None  Review of Systems  Constitutional: Negative for fever.  Eyes: Negative for blurred vision.  Gastrointestinal: Positive for abdominal pain (contractions).    Dilation: 6.5 Effacement (%): 90 Station: -1 Exam by:: Weston,RN Blood pressure 113/67, pulse 87, resp. rate 20, height 5\' 7"  (1.702 m), weight 78.472 kg (173 lb), last menstrual period 10/18/2011, SpO2 100.00%. Maternal Exam:  Uterine Assessment: Contraction strength is firm.  Contraction frequency is regular.   Abdomen: Fundal height is S=D.   Fetal presentation: vertex Per RN exam  Cervix: Cervix evaluated by digital exam.     Fetal Exam Fetal Monitor Review: Mode: ultrasound.   Baseline rate: 150.   Variability: moderate (6-25 bpm).       Physical Exam  Nursing note and vitals reviewed. Constitutional: She is oriented to person, place, and time. She appears well-developed and well-nourished. She appears distressed.  HENT:  Head: Normocephalic.  Eyes: Pupils are equal, round, and reactive to light.  Cardiovascular: Regular rhythm.  Tachycardia present.   Respiratory: Effort normal and breath sounds normal.  GI: Soft. There is no tenderness.  Musculoskeletal: Normal range of motion. She exhibits no edema and no tenderness.  Neurological: She is alert and oriented to person, place, and time. She has normal reflexes.  Skin: Skin is warm and dry.  Psychiatric: She has a normal mood and affect.    Prenatal labs: ABO, Rh: A/POS/-- (04/10 1119) Antibody: NEG (04/10 1119) Rubella: 5.39 (04/10 1119) RPR: NON REAC (04/10 1119)  HBsAg: NEGATIVE (04/10 1119)  HIV: NON REACTIVE (04/10 1119)  GBS: Negative (07/16 0037)  Too late for genetic screening.  1 hour GTT 98  Assessment: 1. Labor: active 2. Fetal Wellbeing: Category II (short tracing before transfer to L&D)  3. Pain Control: requesting epidural 4. GBS: neg 5. 37.2 week IUP  Plan:  1. Admit to BS per consult with MD 2. Routine L&D orders 3. Analgesia/anesthesia PRN   Dorathy Kinsman 07/28/2012, 6:54 AM

## 2012-07-29 ENCOUNTER — Encounter: Payer: Medicaid Other | Admitting: Obstetrics and Gynecology

## 2012-07-29 LAB — CBC
Hemoglobin: 11.1 g/dL — ABNORMAL LOW (ref 12.0–15.0)
RBC: 3.44 MIL/uL — ABNORMAL LOW (ref 3.87–5.11)

## 2012-07-29 MED ORDER — SODIUM BICARBONATE 8.4 % IV SOLN
INTRAVENOUS | Status: DC | PRN
  Administered 2012-07-28: 5 mL via EPIDURAL

## 2012-07-29 MED ORDER — FENTANYL 2.5 MCG/ML BUPIVACAINE 1/10 % EPIDURAL INFUSION (WH - ANES)
INTRAMUSCULAR | Status: DC | PRN
  Administered 2012-07-28: 14 mL/h via EPIDURAL

## 2012-07-29 NOTE — Clinical Social Work Maternal (Signed)
    Clinical Social Work Department PSYCHOSOCIAL ASSESSMENT - MATERNAL/CHILD 07/29/2012  Patient:  Candace Booker, Candace Booker  Account Number:  [id number]  Admit Date:  07/28/2012  Marjo Bicker Name:   Candace Booker    Clinical Social Worker:  Nobie Putnam, LCSW   Date/Time:  07/29/2012 01:00 PM  Date Referred:  07/29/2012   Referral source  CN     Referred reason  Substance Abuse   Other referral source:    I:  FAMILY / HOME ENVIRONMENT Child's legal guardian:  PARENT  Guardian - Name Guardian - Age Guardian - Address  Candace Booker 26 [address] [place], [place] [address]   Other household support members/support persons Name Relationship DOB  Zeb Comfort MOTHER    SON 01/21/11   DAUGHTER 11/09/05   SON 10/30/07   Other support:    II  PSYCHOSOCIAL DATA Information Source:  Patient Interview  Surveyor, quantity and Community Resources Employment:   Financial resources:  Medicaid If Medicaid - County:  [place] Other  Sales executive  WIC   School / Grade:   Maternity Care Coordinator / Child Services Coordination / Early Interventions:  Cultural issues impacting care:    III  STRENGTHS Strengths  Adequate Resources  Home prepared for Child (including basic supplies)  Supportive family/friends   Strength comment:    IV  RISK FACTORS AND CURRENT PROBLEMS Current Problem:  YES   Risk Factor & Current Problem Patient Issue Family Issue Risk Factor / Current Problem Comment  Substance Abuse Y N Hx of MJ use  Other - See comment Y N [employer]    V  SOCIAL WORK ASSESSMENT CSW referral received to assess pt's history of MJ use. Prior to pregnancy confirmation, pt admits to smoking MJ every day.  Once pregnancy was confirmed, she smoked "every blue moon" to help increase her appetite.  She last smoked, one week ago.  CSW explained hospital drug testing policy & pt verbalized understanding.  UDS is negative, meconium results are pending.  Pt is familiar with policy  because her son born last year, was positive for MJ.  She does not have an open CPS case now, as per pt.  She was accompanied by the FOB, her mother & her 62 year old son.  She has all the necessary supplies for the infant & good family support.  CSW will continue to monitor drug screen results & make a referral if necessary.      VI SOCIAL WORK PLAN Social Work Plan  No Further Intervention Required / No Barriers to Discharge   Type of pt/family education:   If child protective services report - county:   If child protective services report - date:   Information/referral to community resources comment:   Other social work plan:

## 2012-07-29 NOTE — Progress Notes (Signed)
UR chart review completed.  

## 2012-07-29 NOTE — Progress Notes (Signed)
I have seen this patient and agree with the above PA student's note.  LEFTWICH-KIRBY, Seith Aikey Certified Nurse-Midwife 

## 2012-07-29 NOTE — Progress Notes (Signed)
Post Partum Day 1 Subjective: Overall, doing well.  Minimal bleeding, + flatus, and will be approved for BTL on 7/19.  States she's been having dull,  achy RLQ pain since yesterday.  Has been breastfeeding.     Objective: Blood pressure 108/72, pulse 61, temperature 98.2 F (36.8 C), temperature source Oral, resp. rate 20, height 5\' 7"  (1.702 m), weight 78.472 kg (173 lb), last menstrual period 10/18/2011, SpO2 100.00%, unknown if currently breastfeeding.  Physical Exam:  General: alert and cooperative Lochia: appropriate Uterine Fundus: firm Abdomen:  2x2 mass felt on right lower quadrant close to midline, mildly tender, no rebound tenderness.  DVT Evaluation: No evidence of DVT seen on physical exam.   Recent Labs  07/28/12 0720 07/29/12 0555  HGB 12.4 11.1*  HCT 36.0 32.7*    Assessment/Plan: Plan for discharge tomorrow and Breastfeeding   LOS: 1 day   Candace Booker 07/29/2012, 7:34 AM

## 2012-07-29 NOTE — Anesthesia Postprocedure Evaluation (Signed)
  Anesthesia Post-op Note  Patient: Candace Booker  Procedure(s) Performed: * No procedures listed *  Patient Location: PACU and Mother/Baby  Anesthesia Type:Epidural  Level of Consciousness: awake, alert  and oriented  Airway and Oxygen Therapy: Patient Spontanous Breathing  Post-op Pain: none  Post-op Assessment: Post-op Vital signs reviewed, Patient's Cardiovascular Status Stable, No headache, No backache, No residual numbness and No residual motor weakness  Post-op Vital Signs: Reviewed and stable  Complications: No apparent anesthesia complications

## 2012-07-29 NOTE — Anesthesia Procedure Notes (Signed)

## 2012-07-30 ENCOUNTER — Encounter: Payer: Self-pay | Admitting: Nurse Practitioner

## 2012-07-30 NOTE — Discharge Summary (Signed)
Obstetric Discharge Summary Reason for Admission: onset of labor Prenatal Procedures: none Intrapartum Procedures: spontaneous vaginal delivery Postpartum Procedures: none Complications-Operative and Postpartum: none Hemoglobin  Date Value Range Status  07/29/2012 11.1* 12.0 - 15.0 g/dL Final     HCT  Date Value Range Status  07/29/2012 32.7* 36.0 - 46.0 % Final    Physical Exam:  General: alert, cooperative, appears stated age and no distress Lochia: appropriate Uterine Fundus: firm DVT Evaluation: No evidence of DVT.   Discharge Diagnoses: Term Pregnancy-delivered  Discharge Information: Date: 07/30/2012 Activity: pelvic rest Diet: routine Medications: Percocet Condition: stable Instructions: refer to practice specific booklet Discharge to: home Follow-up Information   Follow up with [facility]. Schedule an appointment as soon as possible for a visit in 6 weeks.   Contact information:   [address] [place] [address]       Newborn Data: Live born female  Birth Weight: 5 lb 10.5 oz (2566 g) APGAR: 9, 9  Home with mother.  Roxan Hockey, Unique 07/30/2012, 7:19 AM  Evaluation and management procedures were performed by pa-s under my supervision/collaboration. Chart reviewed, patient examined by me and I agree with management and plan.

## 2012-07-30 NOTE — Lactation Note (Signed)
This note was copied from the chart of Candace Booker. Lactation Consultation Note  Patient Name: Candace Megann Easterwood ONGEX'B Date: 07/30/2012 Reason for consult: Follow-up assessment   Maternal Data    Feeding   LATCH Score/Interventions                      Lactation Tools Discussed/Used     Consult Status Consult Status: Complete  Experienced BF mom was bottle feeding formula when I went into room. Reports that breast feeding is going well. Reports that breasts are feeling fuller this morning. Reviewed engorgement prevention and treatment. No questions at present. To call prn  Pamelia Hoit 07/30/2012, 8:20 AM

## 2012-08-27 ENCOUNTER — Ambulatory Visit: Payer: Medicaid Other | Admitting: Nurse Practitioner

## 2012-09-06 ENCOUNTER — Encounter (HOSPITAL_COMMUNITY): Payer: Self-pay

## 2012-09-08 ENCOUNTER — Ambulatory Visit (HOSPITAL_COMMUNITY)
Admission: RE | Admit: 2012-09-08 | Payer: Medicaid Other | Source: Ambulatory Visit | Admitting: Obstetrics and Gynecology

## 2012-09-08 ENCOUNTER — Encounter (HOSPITAL_COMMUNITY): Admission: RE | Payer: Self-pay | Source: Ambulatory Visit

## 2012-09-08 SURGERY — SALPINGECTOMY, BILATERAL, LAPAROSCOPIC
Anesthesia: General

## 2012-09-11 NOTE — Progress Notes (Signed)
Patient did not present on time for scheduled bilateral salpingectomy for permanent sterilization. Case was cancelled

## 2012-09-15 ENCOUNTER — Ambulatory Visit (INDEPENDENT_AMBULATORY_CARE_PROVIDER_SITE_OTHER): Payer: Medicaid Other | Admitting: Obstetrics & Gynecology

## 2012-09-15 ENCOUNTER — Encounter: Payer: Self-pay | Admitting: Obstetrics & Gynecology

## 2012-09-15 VITALS — BP 112/88 | HR 98 | Ht 66.0 in | Wt 160.3 lb

## 2012-09-15 DIAGNOSIS — Z302 Encounter for sterilization: Secondary | ICD-10-CM

## 2012-09-15 NOTE — Progress Notes (Signed)
Patient ID: Candace Booker, female   DOB: Nov 23, 1985, 27 y.o.   MRN: [id number] BTL was cancelled 8/30, states it is rescheduled 0730 on 9/23.  09/15/2012 Adam Phenix, MD

## 2012-09-17 ENCOUNTER — Encounter (HOSPITAL_COMMUNITY): Payer: Self-pay | Admitting: Pharmacist

## 2012-09-21 ENCOUNTER — Ambulatory Visit (HOSPITAL_COMMUNITY)
Admission: RE | Admit: 2012-09-21 | Discharge: 2012-09-21 | Disposition: A | Discharge status: Home / Self Care | Payer: Medicaid Other | Source: Ambulatory Visit | Attending: Obstetrics and Gynecology | Admitting: Obstetrics and Gynecology

## 2012-09-21 ENCOUNTER — Ambulatory Visit (HOSPITAL_COMMUNITY): Payer: Medicaid Other | Admitting: Anesthesiology

## 2012-09-21 ENCOUNTER — Encounter (HOSPITAL_COMMUNITY): Payer: Self-pay | Admitting: Anesthesiology

## 2012-09-21 ENCOUNTER — Encounter (HOSPITAL_COMMUNITY): Payer: Self-pay | Admitting: *Deleted

## 2012-09-21 ENCOUNTER — Encounter (HOSPITAL_COMMUNITY)
Admission: RE | Disposition: A | Discharge status: Home / Self Care | Payer: Self-pay | Source: Ambulatory Visit | Attending: Obstetrics and Gynecology

## 2012-09-21 DIAGNOSIS — Z302 Encounter for sterilization: Secondary | ICD-10-CM | POA: Insufficient documentation

## 2012-09-21 HISTORY — PX: LAPAROSCOPIC BILATERAL SALPINGECTOMY: SHX5889

## 2012-09-21 LAB — CBC
HCT: 37.3 % (ref 36.0–46.0)
Hemoglobin: 12.5 g/dL (ref 12.0–15.0)
MCH: 31.4 pg (ref 26.0–34.0)
MCHC: 33.5 g/dL (ref 30.0–36.0)

## 2012-09-21 SURGERY — SALPINGECTOMY, BILATERAL, LAPAROSCOPIC
Anesthesia: General | Site: Abdomen | Laterality: Bilateral | Wound class: Clean Contaminated

## 2012-09-21 MED ORDER — MIDAZOLAM HCL 2 MG/2ML IJ SOLN: 0.5000 mg | Freq: Once | INTRAMUSCULAR | Status: DC | PRN

## 2012-09-21 MED ORDER — LACTATED RINGERS IV SOLN
INTRAVENOUS | Status: DC
  Administered 2012-09-21: 14:00:00 via INTRAVENOUS

## 2012-09-21 MED ORDER — ROCURONIUM BROMIDE 50 MG/5ML IV SOLN
INTRAVENOUS | Status: AC
  Filled 2012-09-21: qty 1

## 2012-09-21 MED ORDER — MIDAZOLAM HCL 2 MG/2ML IJ SOLN
INTRAMUSCULAR | Status: AC
  Filled 2012-09-21: qty 2

## 2012-09-21 MED ORDER — PROPOFOL INFUSION 10 MG/ML OPTIME
INTRAVENOUS | Status: DC | PRN
  Administered 2012-09-21: 5 mL via INTRAVENOUS
  Administered 2012-09-21: 15 mL via INTRAVENOUS

## 2012-09-21 MED ORDER — OXYCODONE-ACETAMINOPHEN 5-325 MG PO TABS: 1.0000 | ORAL_TABLET | Freq: Four times a day (QID) | ORAL | Status: DC | PRN

## 2012-09-21 MED ORDER — IBUPROFEN 800 MG PO TABS: 800.0000 mg | ORAL_TABLET | Freq: Three times a day (TID) | ORAL | Status: DC | PRN

## 2012-09-21 MED ORDER — BUPIVACAINE HCL (PF) 0.25 % IJ SOLN
INTRAMUSCULAR | Status: AC
  Filled 2012-09-21: qty 30

## 2012-09-21 MED ORDER — ONDANSETRON HCL 4 MG/2ML IJ SOLN
INTRAMUSCULAR | Status: DC | PRN
  Administered 2012-09-21: 4 mg via INTRAVENOUS

## 2012-09-21 MED ORDER — MEPERIDINE HCL 25 MG/ML IJ SOLN: 6.2500 mg | INTRAMUSCULAR | Status: DC | PRN

## 2012-09-21 MED ORDER — SODIUM CHLORIDE 0.9 % IJ SOLN
INTRAMUSCULAR | Status: DC | PRN
  Administered 2012-09-21: 10 mL

## 2012-09-21 MED ORDER — BUPIVACAINE HCL (PF) 0.25 % IJ SOLN
INTRAMUSCULAR | Status: DC | PRN
  Administered 2012-09-21: 30 mL

## 2012-09-21 MED ORDER — PROMETHAZINE HCL 25 MG/ML IJ SOLN: 6.2500 mg | INTRAMUSCULAR | Status: DC | PRN

## 2012-09-21 MED ORDER — FENTANYL CITRATE 0.05 MG/ML IJ SOLN: 25.0000 ug | INTRAMUSCULAR | Status: DC | PRN

## 2012-09-21 MED ORDER — DEXAMETHASONE SODIUM PHOSPHATE 10 MG/ML IJ SOLN
INTRAMUSCULAR | Status: AC
  Filled 2012-09-21: qty 1

## 2012-09-21 MED ORDER — PROPOFOL 10 MG/ML IV EMUL
INTRAVENOUS | Status: AC
  Filled 2012-09-21: qty 20

## 2012-09-21 MED ORDER — KETOROLAC TROMETHAMINE 30 MG/ML IJ SOLN: 15.0000 mg | Freq: Once | INTRAMUSCULAR | Status: DC | PRN

## 2012-09-21 MED ORDER — LIDOCAINE HCL (CARDIAC) 20 MG/ML IV SOLN
INTRAVENOUS | Status: DC | PRN
  Administered 2012-09-21: 60 mg via INTRAVENOUS

## 2012-09-21 MED ORDER — SILVER SULFADIAZINE 1 % EX CREA
TOPICAL_CREAM | CUTANEOUS | Status: AC
  Filled 2012-09-21: qty 50

## 2012-09-21 MED ORDER — LIDOCAINE HCL (CARDIAC) 20 MG/ML IV SOLN
INTRAVENOUS | Status: AC
  Filled 2012-09-21: qty 5

## 2012-09-21 MED ORDER — NEOSTIGMINE METHYLSULFATE 1 MG/ML IJ SOLN
INTRAMUSCULAR | Status: AC
  Filled 2012-09-21: qty 1

## 2012-09-21 MED ORDER — FENTANYL CITRATE 0.05 MG/ML IJ SOLN
INTRAMUSCULAR | Status: AC
  Filled 2012-09-21: qty 5

## 2012-09-21 MED ORDER — NEOSTIGMINE METHYLSULFATE 1 MG/ML IJ SOLN
INTRAMUSCULAR | Status: DC | PRN
  Administered 2012-09-21 (×2): 3 mg via INTRAVENOUS

## 2012-09-21 MED ORDER — ONDANSETRON HCL 4 MG/2ML IJ SOLN
INTRAMUSCULAR | Status: AC
  Filled 2012-09-21: qty 2

## 2012-09-21 MED ORDER — ROCURONIUM BROMIDE 100 MG/10ML IV SOLN
INTRAVENOUS | Status: DC | PRN
  Administered 2012-09-21: 5 mg via INTRAVENOUS
  Administered 2012-09-21: 45 mg via INTRAVENOUS

## 2012-09-21 MED ORDER — DIPHENHYDRAMINE HCL 50 MG/ML IJ SOLN
INTRAMUSCULAR | Status: DC | PRN
  Administered 2012-09-21: 25 mg via INTRAVENOUS

## 2012-09-21 MED ORDER — GLYCOPYRROLATE 0.2 MG/ML IJ SOLN
INTRAMUSCULAR | Status: DC | PRN
  Administered 2012-09-21: .6 mg via INTRAVENOUS
  Administered 2012-09-21: 6 mg via INTRAVENOUS

## 2012-09-21 MED ORDER — FENTANYL CITRATE 0.05 MG/ML IJ SOLN
INTRAMUSCULAR | Status: DC | PRN
  Administered 2012-09-21: 50 ug via INTRAVENOUS
  Administered 2012-09-21 (×2): 100 ug via INTRAVENOUS

## 2012-09-21 MED ORDER — GLYCOPYRROLATE 0.2 MG/ML IJ SOLN
INTRAMUSCULAR | Status: AC
  Filled 2012-09-21: qty 3

## 2012-09-21 MED ORDER — MIDAZOLAM HCL 5 MG/5ML IJ SOLN
INTRAMUSCULAR | Status: DC | PRN
  Administered 2012-09-21: 2 mg via INTRAVENOUS

## 2012-09-21 MED ORDER — DEXAMETHASONE SODIUM PHOSPHATE 4 MG/ML IJ SOLN
INTRAMUSCULAR | Status: DC | PRN
  Administered 2012-09-21: 10 mg via INTRAVENOUS

## 2012-09-21 SURGICAL SUPPLY — 29 items
BANDAGE ADHESIVE 1X3 (GAUZE/BANDAGES/DRESSINGS) ×2 IMPLANT
CABLE HIGH FREQUENCY MONO STRZ (ELECTRODE) IMPLANT
CATH ROBINSON RED A/P 16FR (CATHETERS) IMPLANT
CHLORAPREP W/TINT 26ML (MISCELLANEOUS) ×2 IMPLANT
CLOTH BEACON ORANGE TIMEOUT ST (SAFETY) ×2 IMPLANT
DERMABOND ADHESIVE PROPEN (GAUZE/BANDAGES/DRESSINGS) ×1
DERMABOND ADVANCED .7 DNX6 (GAUZE/BANDAGES/DRESSINGS) ×1 IMPLANT
GLOVE BIO SURGEON STRL SZ7 (GLOVE) ×2 IMPLANT
GLOVE BIOGEL PI IND STRL 7.0 (GLOVE) ×1 IMPLANT
GLOVE BIOGEL PI INDICATOR 7.0 (GLOVE) ×1
GOWN PREVENTION PLUS LG XLONG (DISPOSABLE) ×4 IMPLANT
GOWN PREVENTION PLUS XLARGE (GOWN DISPOSABLE) ×2 IMPLANT
MANIPULATOR UTERINE 4.5 ZUMI (MISCELLANEOUS) ×2 IMPLANT
NEEDLE INSUFFLATION 120MM (ENDOMECHANICALS) ×2 IMPLANT
NS IRRIG 1000ML POUR BTL (IV SOLUTION) ×2 IMPLANT
PACK LAPAROSCOPY BASIN (CUSTOM PROCEDURE TRAY) ×2 IMPLANT
POUCH SPECIMEN RETRIEVAL 10MM (ENDOMECHANICALS) IMPLANT
PROTECTOR NERVE ULNAR (MISCELLANEOUS) ×2 IMPLANT
SCALPEL HARMONIC ACE (MISCELLANEOUS) IMPLANT
SET IRRIG TUBING LAPAROSCOPIC (IRRIGATION / IRRIGATOR) IMPLANT
SUT VIC AB 3-0 X1 27 (SUTURE) ×2 IMPLANT
SUT VICRYL 0 UR6 27IN ABS (SUTURE) ×4 IMPLANT
SUT VICRYL 4-0 PS2 18IN ABS (SUTURE) ×2 IMPLANT
TOWEL OR 17X24 6PK STRL BLUE (TOWEL DISPOSABLE) ×4 IMPLANT
TRAY FOLEY CATH 14FR (SET/KITS/TRAYS/PACK) ×2 IMPLANT
TROCAR BALLN 12MMX100 BLUNT (TROCAR) IMPLANT
TROCAR OPTI TIP 5M 100M (ENDOMECHANICALS) IMPLANT
TROCAR XCEL DIL TIP R 11M (ENDOMECHANICALS) IMPLANT
WATER STERILE IRR 1000ML POUR (IV SOLUTION) ×2 IMPLANT

## 2012-09-21 NOTE — Anesthesia Postprocedure Evaluation (Signed)
  Anesthesia Post Note  Patient: Candace Booker  Procedure(s) Performed: Procedure(s) (LRB): LAPAROSCOPIC BILATERAL SALPINGECTOMY (Bilateral)  Anesthesia type: GA  Patient location: PACU  Post pain: Pain level controlled  Post assessment: Post-op Vital signs reviewed  Last Vitals:  Filed Vitals:   09/21/12 1730  BP: 102/42  Pulse: 73  Temp:   Resp: 16    Post vital signs: Reviewed  Level of consciousness: sedated  Complications: No apparent anesthesia complications; Skin rash almost completely gone.  Never any hemodynamic issues or bronchospasm.  Discusses rash with patient.

## 2012-09-21 NOTE — Op Note (Signed)
09/21/2012  4:17 PM  PATIENT:  Candace Booker  27 y.o. female  PRE-OPERATIVE DIAGNOSIS:  desired stelization  POST-OPERATIVE DIAGNOSIS:  desired sterilization  PROCEDURE:  Procedure(s): LAPAROSCOPIC BILATERAL SALPINGECTOMY (Bilateral)  SURGEON:  Surgeon(s) and Role:    * Willodean Rosenthal, MD - Primary    * Catalina Antigua, MD - Assisting  ANESTHESIA:   general  EBL:  Total I/O In: 1000 [I.V.:1000] Out: 55 [Urine:30; Blood:25]  BLOOD ADMINISTERED:none  DRAINS: none   LOCAL MEDICATIONS USED:  MARCAINE     SPECIMEN:  Source of Specimen:  fallopian tubes  DISPOSITION OF SPECIMEN:  PATHOLOGY  COUNTS:  YES  PLAN OF CARE: Discharge to home after PACU  PATIENT DISPOSITION:  PACU - hemodynamically stable.   Delay start of Pharmacological VTE agent (>24hrs) due to surgical blood loss or risk of bleeding: no  IINDICATIONS: 26 [id number] with undesired fertility, desires permanent sterilization. Other reversible forms of contraception were discussed with patient; she declines all other modalities.  Risks of procedure discussed with patient including permanence of method, risk of regret, bleeding, infection, injury to surrounding organs and need for additional procedures including laparotomy.  Failure risk less than 0.5% with increased risk of ectopic gestation if pregnancy occurs was also discussed with patient.  Written informed consent was obtained.    FINDINGS:  Normal uterus, fallopian tubes, and ovaries.  TECHNIQUE:  The patient was taken to the operating room where general anesthesia was obtained without difficulty.  She was then placed in the dorsal lithotomy position and prepared and draped in sterile fashion.  After an adequate timeout was performed, a bivalved speculum was then placed in the patient's vagina, and the anterior lip of cervix grasped with the single-tooth tenaculum.  The uterine manipulator was then advanced into the uterus.  The speculum was removed from  the vagina.  Attention was then turned to the patient's abdomen where a 5-mm skin incision was made in the umbilical fold.  A 5-mm trocar and sleeve were then advanced without difficulty with the laparoscope under direct visualization into the abdomen.  The abdomen was then insufflated with carbon dioxide gas.  Adequate pneumoperitoneum was obtained.  A survey of the patient's pelvis and abdomen revealed the findings above.  Bilateral 5-mm lower quadrant ports  were then placed under direct visualization.  The fallopian tubes were transected from the uterine attachments and the underlying mesosalpinx with the harmonic scalpel device allowing for bilateral salpingectomy.  The fallopian tubes were then removed from the abdomen under direct visualization.  The operative site was surveyed, and it was found to be hemostatic.   No intraoperative injury to other surrounding organs was noted.  The abdomen was desufflated and all instruments were then removed from the patient's abdomen.  All skin incisions were closed with 3-0 vicryl.  The uterine manipulator was removed from the vagina without complications. The patient tolerated the procedure well.  Sponge, lap, and needle counts were correct times two.  The patient was then taken to the recovery room awake, extubated and in stable condition.  The patient will be discharged to home as per PACU criteria.  Routine postoperative instructions given.  She was prescribed Dilaudid, Ibuprofen and Colace.  She will follow up in the clinic 4 weeks postoperative evaluation.

## 2012-09-21 NOTE — Anesthesia Postprocedure Evaluation (Signed)
  Anesthesia Post-op Note  Patient: Candace Booker  Procedure(s) Performed: Procedure(s): LAPAROSCOPIC BILATERAL SALPINGECTOMY (Bilateral)  Patient Location: PACU  Anesthesia Type:General  Level of Consciousness: awake, alert  and oriented  Airway and Oxygen Therapy: Patient Spontanous Breathing and Patient connected to nasal cannula oxygen  Post-op Pain: none  Post-op Assessment: Post-op Vital signs reviewed and Patient's Cardiovascular Status Stable  Post-op Vital Signs: Reviewed and stable  Complications: No apparent anesthesia complications

## 2012-09-21 NOTE — H&P (Signed)
Candace Booker is an 27 y.o. female. Pt is scheduled for bilateral salpingectomy for sterilization. Pt reports that she is a [id number] who does not want anymore children ever and would like her fallopian tubes removed.  Pertinent Gynecological History: Menses: flow is moderate Bleeding: normal Contraception: abstinence DES exposure: denies Blood transfusions: none Sexually transmitted diseases: past history: trich 2014 Previous GYN Procedures: SVD x4    Menstrual History: LMP 1 week prev     Past Medical History  Diagnosis Date  . Asthma   . Scoliosis   . Preterm labor     Past Surgical History  Procedure Laterality Date  . No past surgeries      Family History  Problem Relation Age of Onset  . Diabetes Maternal Aunt     Social History:  reports that she has been smoking Cigarettes.  She has been smoking about 0.25 packs per day. She does not have any smokeless tobacco history on file. She reports that she uses illicit drugs (Marijuana). She reports that she does not drink alcohol. 4 blunts/day- last use yesterday   Allergies: No Known Allergies  No prescriptions prior to admission    ROS  Blood pressure 109/69, pulse 62, temperature 98.2 F (36.8 C), temperature source Oral, resp. rate 20, SpO2 100.00%, not currently breastfeeding. Physical Exam Pt in NAD Lungs: CTA CV: RRR Abd; soft,NT, ND No incisions   Results for orders placed during the hospital encounter of 09/21/12 (from the past 24 hour(s))  PREGNANCY, URINE     Status: None   Collection Time    09/21/12  1:30 PM      Result Value Range   Preg Test, Ur NEGATIVE  NEGATIVE  CBC     Status: None   Collection Time    09/21/12  2:00 PM      Result Value Range   WBC 7.2  4.0 - 10.5 K/uL   RBC 3.98  3.87 - 5.11 MIL/uL   Hemoglobin 12.5  12.0 - 15.0 g/dL   HCT 45.4  09.8 - 11.9 %   MCV 93.7  78.0 - 100.0 fL   MCH 31.4  26.0 - 34.0 pg   MCHC 33.5  30.0 - 36.0 g/dL   RDW 14.7  82.9 - 56.2 %   Platelets 266  150 - 400 K/uL    No results found.  Assessment/Plan: Patient desires surgical management with bilateral salpingectomy.  The risks of surgery were discussed in detail with the patient including but not limited to: bleeding which may require transfusion or reoperation; infection which may require prolonged hospitalization or re-hospitalization and antibiotic therapy; injury to bowel, bladder, ureters and major vessels or other surrounding organs; need for additional procedures including laparotomy; thromboembolic phenomenon, incisional problems and other postoperative or anesthesia complications.  Patient was told that the failure rate is 3-04/998 with increased rate of ectopic pregnancy if she does get pregnant; the postoperative expectations were also discussed in detail. The patient also understands the alternative treatment options which were discussed in full. All questions were answered.     HARRAWAY-SMITH, Sami Froh 09/21/2012, 2:38 PM

## 2012-09-21 NOTE — Anesthesia Preprocedure Evaluation (Signed)
Anesthesia Evaluation  Patient identified by MRN, date of birth, ID band Patient awake    Reviewed: Allergy & Precautions, H&P , Patient's Chart, lab work & pertinent test results, reviewed documented beta blocker date and time   History of Anesthesia Complications Negative for: history of anesthetic complications  Airway Mallampati: II TM Distance: >3 FB Neck ROM: full    Dental no notable dental hx.    Pulmonary neg pulmonary ROS, asthma ,  breath sounds clear to auscultation  Pulmonary exam normal       Cardiovascular Exercise Tolerance: Good negative cardio ROS  Rhythm:regular Rate:Normal     Neuro/Psych negative neurological ROS  negative psych ROS   GI/Hepatic negative GI ROS, Neg liver ROS,   Endo/Other  negative endocrine ROS  Renal/GU negative Renal ROS     Musculoskeletal   Abdominal   Peds  Hematology negative hematology ROS (+)   Anesthesia Other Findings scoliosis  Reproductive/Obstetrics negative OB ROS                           Anesthesia Physical Anesthesia Plan  ASA: II  Anesthesia Plan: General ETT   Post-op Pain Management:    Induction:   Airway Management Planned:   Additional Equipment:   Intra-op Plan:   Post-operative Plan:   Informed Consent: I have reviewed the patients History and Physical, chart, labs and discussed the procedure including the risks, benefits and alternatives for the proposed anesthesia with the patient or authorized representative who has indicated his/her understanding and acceptance.   Dental Advisory Given  Plan Discussed with: CRNA and Surgeon  Anesthesia Plan Comments:         Anesthesia Quick Evaluation

## 2012-09-21 NOTE — Brief Op Note (Signed)
09/21/2012  4:17 PM  PATIENT:  Candace Booker  27 y.o. female  PRE-OPERATIVE DIAGNOSIS:  desired stelization  POST-OPERATIVE DIAGNOSIS:  desired sterilization  PROCEDURE:  Procedure(s): LAPAROSCOPIC BILATERAL SALPINGECTOMY (Bilateral)  SURGEON:  Surgeon(s) and Role:    * Willodean Rosenthal, MD - Primary    * Catalina Antigua, MD - Assisting  ANESTHESIA:   general  EBL:  Total I/O In: 1000 [I.V.:1000] Out: 55 [Urine:30; Blood:25]  BLOOD ADMINISTERED:none  DRAINS: none   LOCAL MEDICATIONS USED:  MARCAINE     SPECIMEN:  Source of Specimen:  fallopian tubes  DISPOSITION OF SPECIMEN:  PATHOLOGY  COUNTS:  YES  TOURNIQUET:  * No tourniquets in log *  DICTATION: .Note written in EPIC  PLAN OF CARE: Discharge to home after PACU  PATIENT DISPOSITION:  PACU - hemodynamically stable.   Delay start of Pharmacological VTE agent (>24hrs) due to surgical blood loss or risk of bleeding: no

## 2012-09-21 NOTE — OR Nursing (Signed)
Dr Erin Fulling and Dr Sheral Apley notified of patients rash postop

## 2012-09-21 NOTE — Transfer of Care (Signed)
Immediate Anesthesia Transfer of Care Note  Patient: Candace Booker  Procedure(s) Performed: Procedure(s): LAPAROSCOPIC BILATERAL SALPINGECTOMY (Bilateral)  Patient Location:   Anesthesia Type:General  Level of Consciousness: awake, alert  and oriented  Airway & Oxygen Therapy: Patient Spontanous Breathing and Patient connected to nasal cannula oxygen  Post-op Assessment: Report given to PACU RN and Post -op Vital signs reviewed and stable  Post vital signs: Reviewed and stable  Complications: adverse drug reaction

## 2012-09-21 NOTE — Transfer of Care (Signed)
Immediate Anesthesia Transfer of Care Note  Patient: Candace Booker  Procedure(s) Performed: Procedure(s): LAPAROSCOPIC BILATERAL SALPINGECTOMY (Bilateral)  Patient Location: PACU  Anesthesia Type:General  Level of Consciousness: awake, alert  and oriented  Airway & Oxygen Therapy: Patient Spontanous Breathing and Patient connected to nasal cannula oxygen  Post-op Assessment: Report given to PACU RN and Post -op Vital signs reviewed and stable  Post vital signs: Reviewed and stable  Complications: adverse drug reaction

## 2012-09-22 ENCOUNTER — Encounter (HOSPITAL_COMMUNITY): Payer: Self-pay | Admitting: Obstetrics & Gynecology

## 2012-10-04 ENCOUNTER — Other Ambulatory Visit (HOSPITAL_COMMUNITY): Payer: Medicaid Other

## 2012-10-29 ENCOUNTER — Encounter: Payer: Self-pay | Admitting: *Deleted

## 2013-09-19 ENCOUNTER — Emergency Department (HOSPITAL_COMMUNITY)
Admission: EM | Admit: 2013-09-19 | Discharge: 2013-09-19 | Disposition: A | Discharge status: Home / Self Care | Payer: Medicaid Other | Attending: Emergency Medicine | Admitting: Emergency Medicine

## 2013-09-19 ENCOUNTER — Encounter (HOSPITAL_COMMUNITY): Payer: Self-pay | Admitting: Emergency Medicine

## 2013-09-19 ENCOUNTER — Emergency Department (HOSPITAL_COMMUNITY): Payer: Medicaid Other

## 2013-09-19 DIAGNOSIS — Z8739 Personal history of other diseases of the musculoskeletal system and connective tissue: Secondary | ICD-10-CM | POA: Insufficient documentation

## 2013-09-19 DIAGNOSIS — S6390XA Sprain of unspecified part of unspecified wrist and hand, initial encounter: Secondary | ICD-10-CM | POA: Insufficient documentation

## 2013-09-19 DIAGNOSIS — J45909 Unspecified asthma, uncomplicated: Secondary | ICD-10-CM | POA: Insufficient documentation

## 2013-09-19 DIAGNOSIS — S63610A Unspecified sprain of right index finger, initial encounter: Secondary | ICD-10-CM

## 2013-09-19 DIAGNOSIS — Y929 Unspecified place or not applicable: Secondary | ICD-10-CM | POA: Insufficient documentation

## 2013-09-19 DIAGNOSIS — Y9389 Activity, other specified: Secondary | ICD-10-CM | POA: Insufficient documentation

## 2013-09-19 DIAGNOSIS — F17200 Nicotine dependence, unspecified, uncomplicated: Secondary | ICD-10-CM | POA: Insufficient documentation

## 2013-09-19 DIAGNOSIS — W230XXA Caught, crushed, jammed, or pinched between moving objects, initial encounter: Secondary | ICD-10-CM | POA: Insufficient documentation

## 2013-09-19 DIAGNOSIS — S6990XA Unspecified injury of unspecified wrist, hand and finger(s), initial encounter: Secondary | ICD-10-CM | POA: Insufficient documentation

## 2013-09-19 MED ORDER — HYDROCODONE-ACETAMINOPHEN 5-325 MG PO TABS: 1.0000 | ORAL_TABLET | Freq: Four times a day (QID) | ORAL | Status: DC | PRN

## 2013-09-19 NOTE — ED Provider Notes (Signed)
CSN: [id number]     Arrival date & time 09/19/13  1159 History  This chart was scribed for non-physician practitioner Roxy Horseman, PA-C working with Vida Roller, MD by Leone Payor, ED Scribe. This patient was seen in room TR09C/TR09C and the patient's care was started at 2:01 PM.    Chief Complaint  Patient presents with  . Hand Pain   The history is provided by the patient. No language interpreter was used.    HPI Comments: Candace Booker is a 28 y.o. female who presents to the Emergency Department complaining of a right index finger injury that occurred earlier today. Patient states she "jammed" her finger and heard a popping noise. She has associated constant, worsened swelling and pain. She states the pain is worse with movement or light touch. She denies weakness, numbness.   Past Medical History  Diagnosis Date  . Asthma   . Scoliosis   . Preterm labor    Past Surgical History  Procedure Laterality Date  . No past surgeries    . Laparoscopic bilateral salpingectomy Bilateral 09/21/2012    Procedure: LAPAROSCOPIC BILATERAL SALPINGECTOMY;  Surgeon: Willodean Rosenthal, MD;  Location: [facility];  Service: Gynecology;  Laterality: Bilateral;   Family History  Problem Relation Age of Onset  . Diabetes Maternal Aunt    History  Substance Use Topics  . Smoking status: Current Every Day Smoker -- 0.25 packs/day    Types: Cigarettes  . Smokeless tobacco: Not on file  . Alcohol Use: No   OB History   Grav Para Term Preterm Abortions TAB SAB Ect Mult Living   0 0 0 0 0 4     Review of Systems  Musculoskeletal: Positive for arthralgias and joint swelling.  Neurological: Negative for weakness and numbness.      Allergies  Review of patient's allergies indicates no known allergies.  Home Medications   Prior to Admission medications   Medication Sig Start Date End Date Taking? Authorizing Provider  ibuprofen (ADVIL,MOTRIN) 800 MG tablet Take 1 tablet (800  mg total) by mouth every 8 (eight) hours as needed for pain. 09/21/12   Willodean Rosenthal, MD  oxyCODONE-acetaminophen (PERCOCET/ROXICET) 5-325 MG per tablet Take 1-2 tablets by mouth every 6 (six) hours as needed for pain. 09/21/12   Willodean Rosenthal, MD   BP 132/101  Pulse 93  Temp(Src) 98.3 F (36.8 C) (Oral)  Resp 18  Ht  (1.727 m)  SpO2 98%  LMP 08/22/2013 Physical Exam  Nursing note and vitals reviewed. Constitutional: She is oriented to person, place, and time. She appears well-developed and well-nourished.  HENT:  Head: Normocephalic and atraumatic.  Cardiovascular: Normal rate.   Brisk cap refill   Pulmonary/Chest: Effort normal.  Abdominal: She exhibits no distension.  Musculoskeletal:  Moderate swelling about the right index finger, tenderness to palpation at the MCP and PIP. ROM and strength is reduced secondary to pain.   Neurological: She is alert and oriented to person, place, and time.  Sensation intact.   Skin: Skin is warm and dry.  Psychiatric: She has a normal mood and affect.    ED Course  Procedures (including critical care time)  DIAGNOSTIC STUDIES: Oxygen Saturation is 98% on RA, normal by my interpretation.    COORDINATION OF CARE: 2:03 PM Discussed treatment plan with pt at bedside and pt agreed to plan.   Labs Review Labs Reviewed - No data to display  Imaging Review Dg Finger Index Right  09/19/2013   CLINICAL DATA:  Jammed index finger. Index finger pain and swelling.  EXAM: RIGHT INDEX FINGER 2+V  COMPARISON:  None.  FINDINGS: There is no evidence of fracture or dislocation. There is no evidence of arthropathy or other focal bone abnormality. Soft tissues are unremarkable.  IMPRESSION: Negative.   Electronically Signed   By: Myles Rosenthal M.D.   On: 09/19/2013 13:47     EKG Interpretation None      MDM   Final diagnoses:  Sprain of right index finger, initial encounter    Patient with simple sprain of right index finger,  plain films are negative. Will splint, recommended and followup. Rice therapy.  I personally performed the services described in this documentation, which was scribed in my presence. The recorded information has been reviewed and is accurate.    Roxy Horseman, PA-C 09/19/13 1408

## 2013-09-19 NOTE — ED Notes (Signed)
Pt reports that she jammed her right index finger this afternoon. Reports the area is swollen and tender.

## 2013-09-19 NOTE — Discharge Instructions (Signed)
Finger Sprain  A finger sprain is a tear in one of the strong, fibrous tissues that connect the bones (ligaments) in your finger. The severity of the sprain depends on how much of the ligament is torn. The tear can be either partial or complete.  CAUSES   Often, sprains are a result of a fall or accident. If you extend your hands to catch an object or to protect yourself, the force of the impact causes the fibers of your ligament to stretch too much. This excess tension causes the fibers of your ligament to tear.  SYMPTOMS   You may have some loss of motion in your finger. Other symptoms include:   Bruising.   Tenderness.   Swelling.  DIAGNOSIS   In order to diagnose finger sprain, your caregiver will physically examine your finger or thumb to determine how torn the ligament is. Your caregiver may also suggest an X-ray exam of your finger to make sure no bones are broken.  TREATMENT   If your ligament is only partially torn, treatment usually involves keeping the finger in a fixed position (immobilization) for a short period. To do this, your caregiver will apply a bandage, cast, or splint to keep your finger from moving until it heals. For a partially torn ligament, the healing process usually takes 2 to 3 weeks.  If your ligament is completely torn, you may need surgery to reconnect the ligament to the bone. After surgery a cast or splint will be applied and will need to stay on your finger or thumb for 4 to 6 weeks while your ligament heals.  HOME CARE INSTRUCTIONS   Keep your injured finger elevated, when possible, to decrease swelling.   To ease pain and swelling, apply ice to your joint twice a day, for 2 to 3 days:   Put ice in a plastic bag.   Place a towel between your skin and the bag.   Leave the ice on for 15 minutes.   Only take over-the-counter or prescription medicine for pain as directed by your caregiver.   Do not wear rings on your injured finger.   Do not leave your finger unprotected  until pain and stiffness go away (usually 3 to 4 weeks).   Do not allow your cast or splint to get wet. Cover your cast or splint with a plastic bag when you shower or bathe. Do not swim.   Your caregiver may suggest special exercises for you to do during your recovery to prevent or limit permanent stiffness.  SEEK IMMEDIATE MEDICAL CARE IF:   Your cast or splint becomes damaged.   Your pain becomes worse rather than better.  MAKE SURE YOU:   Understand these instructions.   Will watch your condition.   Will get help right away if you are not doing well or get worse.  Document Released: 02/07/2004 Document Revised: 03/24/2011 Document Reviewed: 09/02/2010  [employer] Patient Information 2015 [employer]. This information is not intended to replace advice given to you by your health care provider. Make sure you discuss any questions you have with your health care provider.

## 2013-09-21 NOTE — ED Provider Notes (Signed)
Medical screening examination/treatment/procedure(s) were performed by non-physician practitioner and as supervising physician I was immediately available for consultation/collaboration.    Vida Roller, MD 09/21/13 [phone]

## 2013-11-14 ENCOUNTER — Encounter (HOSPITAL_COMMUNITY): Payer: Self-pay | Admitting: Emergency Medicine

## 2014-03-20 IMAGING — US US OB COMP +14 WK
1 of 2 series · 12 of 28 positions shown · non-contrast
Comparison: none

[Series 1: us ob comp +14 wk · 12 of 85 slices shown]
[im 1/85]
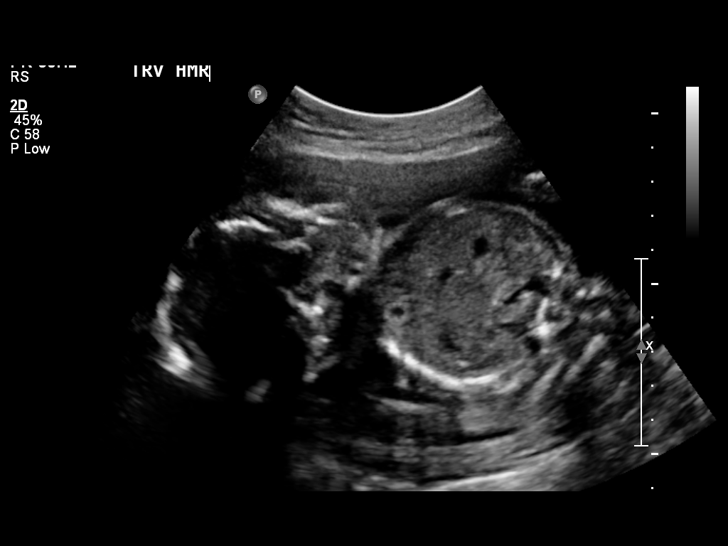
[im 7/85]
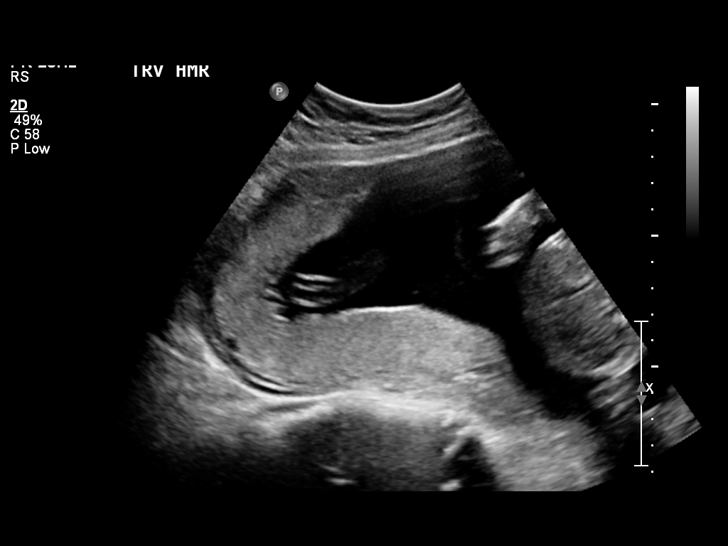
[im 13/85]
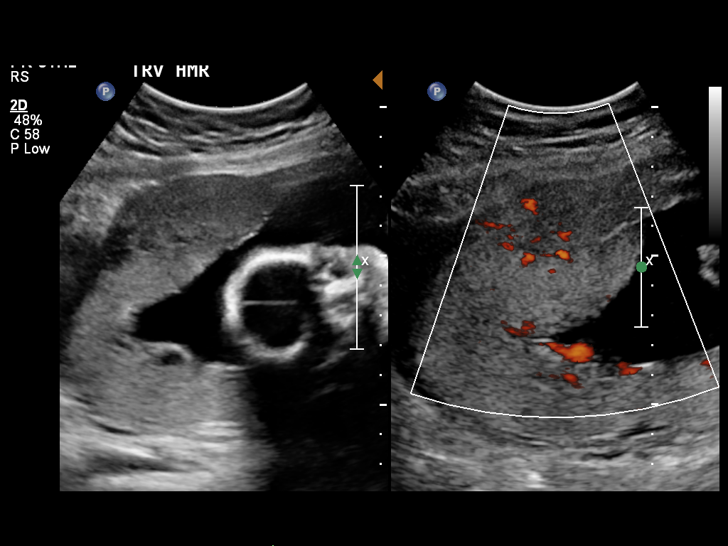
[im 23/85]
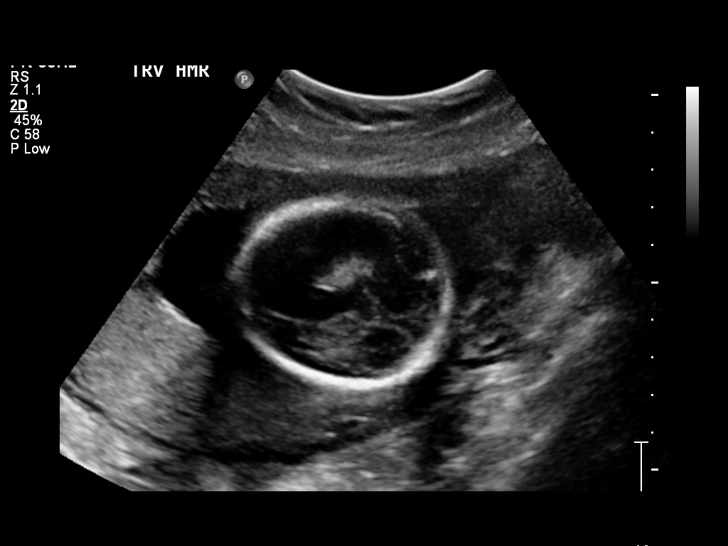
[im 30/85]
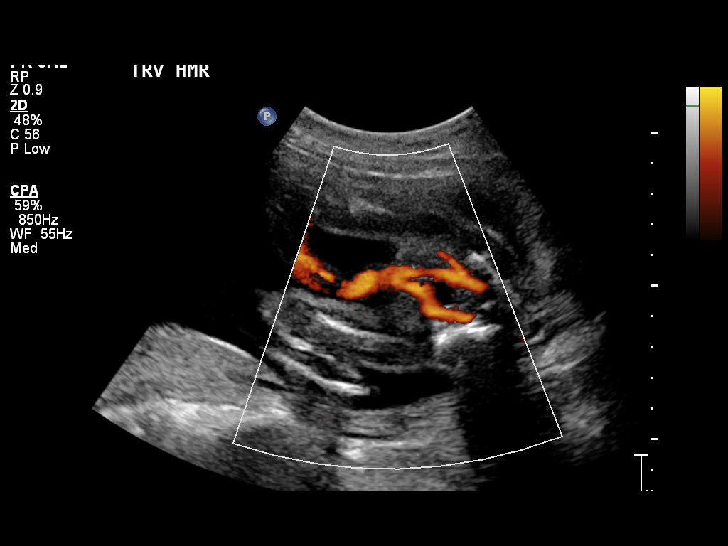
[im 36/85]
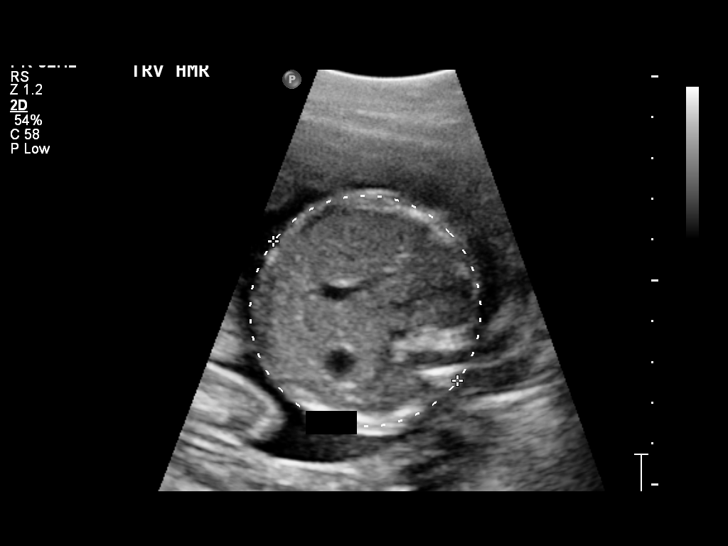
[im 46/85]
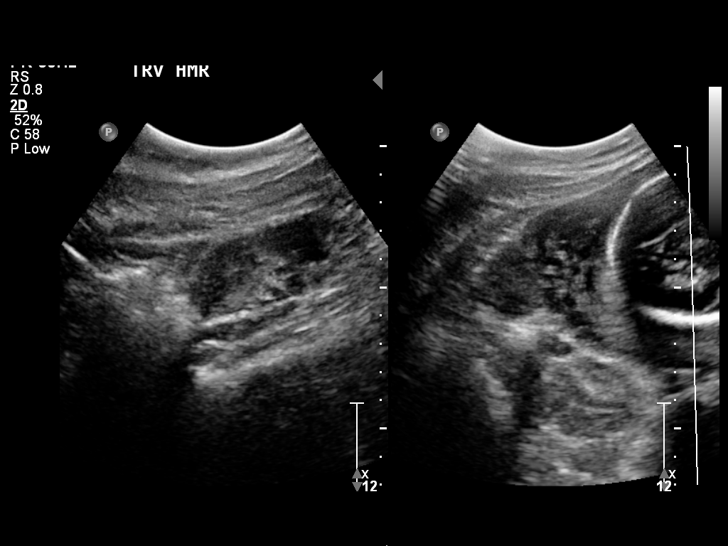
[im 52/85]
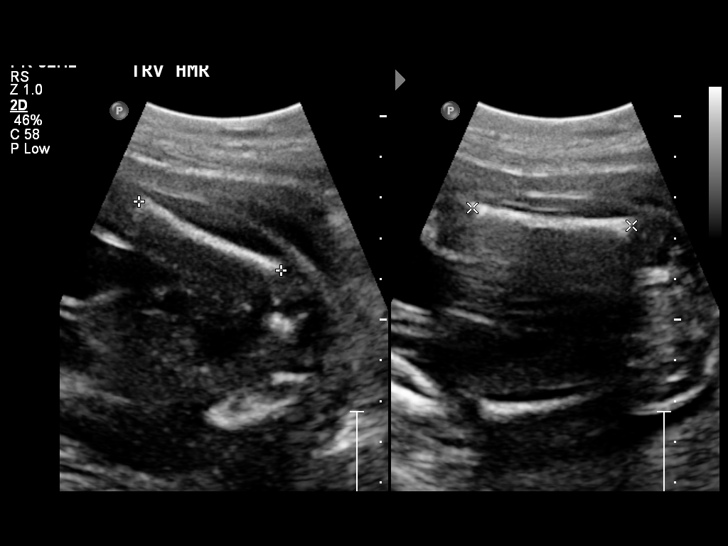
[im 59/85]
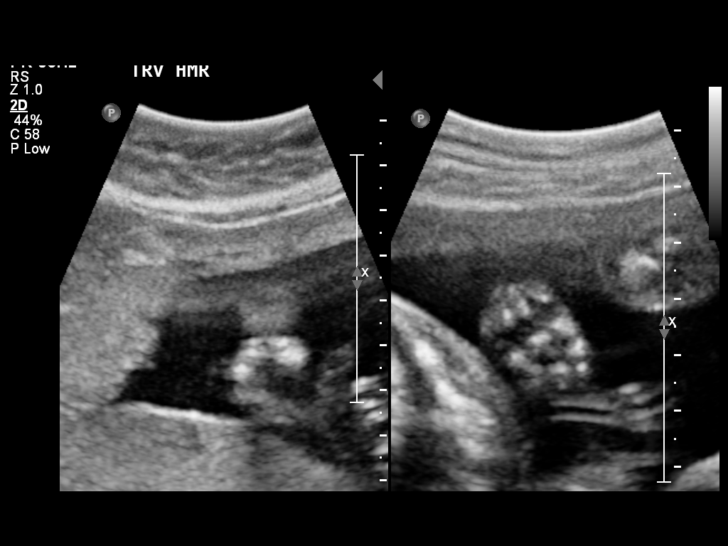
[im 68/85]
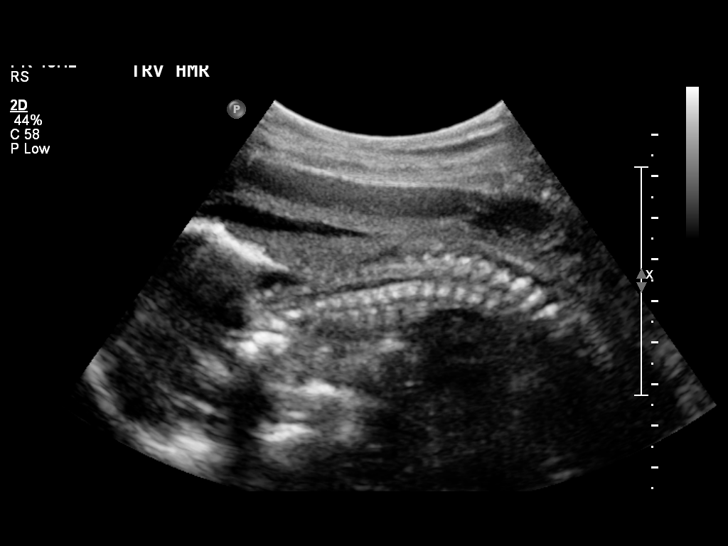
[im 75/85]
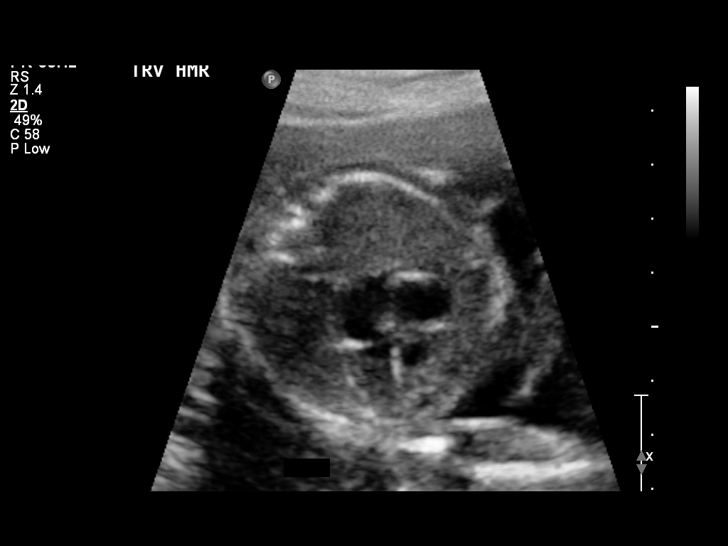
[im 81/85]
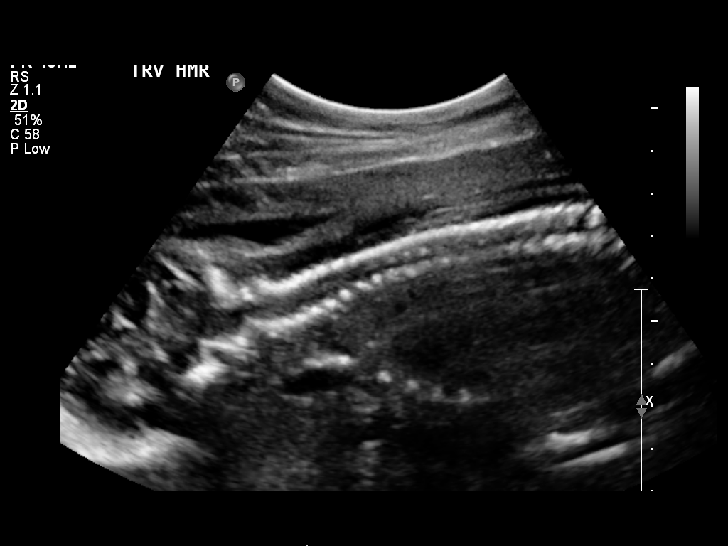

[12 of 28 positions shown; findings below may reference images not displayed]

OBSTETRICS REPORT
                      (Signed Final 04/15/2012 [DATE])

Service(s) Provided

 US OB COMP + 14 WK                                    76805.1
Indications

 Basic anatomic survey
 Unsure of LMP;  Establish Gestational [AGE]
 Cigarette smoker
 No or Little Prenatal Care
Fetal Evaluation

 Num Of Fetuses:    1
 Fetal Heart Rate:  153                         bpm
 Cardiac Activity:  Observed
 Presentation:      Transverse, head to
                    maternal right
 Placenta:          Posterior Fundal, above
                    cervical os
 P. Cord            Visualized, central
 Insertion:

 Amniotic Fluid
 AFI FV:      Subjectively within normal limits
                                             Larg Pckt:   5.37   cm
Biometry

 BPD:     53.4  mm    G. Age:   22w 2d                CI:        73.37   70 - 86
                                                      FL/HC:      19.6   18.4 -

 HC:     198.1  mm    G. Age:   22w 0d       21  %    HC/AC:      1.11   1.06 -

 AC:     179.2  mm    G. Age:   22w 6d       54  %    FL/BPD:     72.8   71 - 87
 FL:      38.9  mm    G. Age:   22w 3d       42  %    FL/AC:      21.7   20 - 24
 HUM:     37.5  mm    G. Age:   23w 1d       64  %
 CER:     23.5  mm    G. Age:   21w 5d       36  %
 Est. FW:     516  gm      1 lb 2 oz     52  %
Gestational Age

 LMP:           25w 5d       Date:   10/18/11                 EDD:   07/24/12
 U/S Today:     22w 3d                                        EDD:   08/16/12
 Best:          22w 3d    Det. By:   U/S (04/15/12)           EDD:   08/16/12
Anatomy

 Cranium:          Appears normal         Aortic Arch:      Appears normal
 Fetal Cavum:      Appears normal         Ductal Arch:      Appears normal
 Ventricles:       Appears normal         Diaphragm:        Appears normal
 Choroid Plexus:   Appears normal         Stomach:          Appears normal
 Cerebellum:       Appears normal         Abdomen:          Appears normal
 Posterior Fossa:  Appears normal         Abdominal Wall:   Appears nml (cord
                                                            insert, abd wall)
 Nuchal Fold:      Not applicable (>20    Cord Vessels:     Appears normal (3
                   wks GA)                                  vessel cord)
 Face:             Appears normal         Kidneys:          Appear normal
                   (orbits and profile)
 Lips:             Appears normal         Bladder:          Appears normal
 Heart:            Appears normal         Spine:            Appears normal
                   (4CH, axis, and
                   situs)
 RVOT:             Appears normal         Lower             Appears normal
                                          Extremities:
 LVOT:             Appears normal         Upper             Appears normal
                                          Extremities:

 Other:  Fetus appears to be a female.
Targeted Anatomy

 Fetal Central Nervous System
 Cisterna Magna:
Cervix Uterus Adnexa

 Cervical Length:   4.1       cm

 Cervix:       Normal appearance by transabdominal scan.
 Uterus:       Single fibroid noted, see table below.
 Cul De Sac:   No free fluid seen.
 Left Ovary:   Not visualized.
 Right Ovary:  Within normal limits.

 Adnexa:     No abnormality visualized.
Myomas

 Site                     L(cm)      W(cm)      D(cm)       Location
 Anterior

 Blood Flow                  RI       PI       Comments

Impression

 Single intrauterine gestation demonstrating an estimated
 gestational age by ultrasound of 22w 3d. Fetal parameters
 correlate well with this composite EGA.

 Visualized fetal anatomy appears normal.No focal placental
 abnormalities are seen.

 Focal fibroid with size and location as noted above.

 Subjectively and quantitatively normal amniotic fluid volume.
 Normal cervical length and appearance.

## 2015-03-26 ENCOUNTER — Emergency Department (HOSPITAL_COMMUNITY)
Admission: EM | Admit: 2015-03-26 | Discharge: 2015-03-26 | Disposition: A | Discharge status: Home / Self Care | Payer: Medicaid Other

## 2015-03-26 NOTE — ED Notes (Signed)
Pt called x 3 , no answer

## 2015-03-26 NOTE — ED Notes (Signed)
Pt calledx2, no answer 

## 2021-05-02 ENCOUNTER — Ambulatory Visit (HOSPITAL_COMMUNITY)
Admission: EM | Admit: 2021-05-02 | Discharge: 2021-05-02 | Disposition: A | Discharge status: Home / Self Care | Payer: Medicaid Other | Attending: Emergency Medicine | Admitting: Emergency Medicine

## 2021-05-02 ENCOUNTER — Encounter (HOSPITAL_COMMUNITY): Payer: Self-pay

## 2021-05-02 ENCOUNTER — Ambulatory Visit (INDEPENDENT_AMBULATORY_CARE_PROVIDER_SITE_OTHER): Payer: Medicaid Other

## 2021-05-02 DIAGNOSIS — M25572 Pain in left ankle and joints of left foot: Secondary | ICD-10-CM | POA: Diagnosis not present

## 2021-05-02 DIAGNOSIS — S93492A Sprain of other ligament of left ankle, initial encounter: Secondary | ICD-10-CM

## 2021-05-02 MED ORDER — IBUPROFEN 600 MG PO TABS: 600.0000 mg | ORAL_TABLET | Freq: Four times a day (QID) | ORAL | 0 refills | Status: DC | PRN

## 2021-05-02 MED ORDER — ACETAMINOPHEN 325 MG PO TABS
650.0000 mg | ORAL_TABLET | Freq: Once | ORAL | Status: AC
  Administered 2021-05-02: 650 mg via ORAL

## 2021-05-02 MED ORDER — ACETAMINOPHEN 325 MG PO TABS
ORAL_TABLET | ORAL | Status: AC
  Filled 2021-05-02: qty 2

## 2021-05-02 NOTE — ED Triage Notes (Signed)
Patient presents to Urgent Care with complaints of l ankle pain  since Thursday night when she was walking down a hill. Patient reports using ice and elevation. 10/10 pain since incience ? ?

## 2021-05-02 NOTE — ED Provider Notes (Addendum)
HPI ? ?SUBJECTIVE: ? ?Candace Booker is a 36 y.o. female who presents with left lateral/anterior ankle pain after stepping in a hole, forcibly plantar flexing her foot 3 days ago.  States that she felt a pop along the ATFL followed by swelling and bruising.  She reports limitation of motion secondary to pain and tingling in her foot.  She has been unable to bear weight on it immediately after or since injuring it.  She tried ice, elevation, 1000 mg Tylenol without improvement in her symptoms.  Symptoms are worse with weightbearing, ice, elevation.  She has a history of left ankle sprain, asthma,  scoliosis.  LMP: Today.  Denies possibility of being pregnant. Orthopedics: None. ? ? ?Past Medical History:  ?Diagnosis Date  ? Asthma   ? Preterm labor   ? Scoliosis   ? ? ?Past Surgical History:  ?Procedure Laterality Date  ? LAPAROSCOPIC BILATERAL SALPINGECTOMY Bilateral 09/21/2012  ? Procedure: LAPAROSCOPIC BILATERAL SALPINGECTOMY;  Surgeon: Willodean Rosenthal, MD;  Location: [facility];  Service: Gynecology;  Laterality: Bilateral;  ? NO PAST SURGERIES    ? ? ?Family History  ?Problem Relation Age of Onset  ? Diabetes Maternal Aunt   ? ? ?Social History  ? ?Tobacco Use  ? Smoking status: Some Days  ?  Packs/day: 0.25  ?  Types: Cigarettes  ?Substance Use Topics  ? Alcohol use: No  ? Drug use: Not Currently  ? ? ?No current facility-administered medications for this encounter. ? ?Current Outpatient Medications:  ?  ibuprofen (ADVIL) 600 MG tablet, Take 1 tablet (600 mg total) by mouth every 6 (six) hours as needed., Disp: 30 tablet, Rfl: 0 ? ?No Known Allergies ? ? ?ROS ? ?As noted in HPI.  ? ?Physical Exam ? ?BP 127/84   Pulse 64   Temp 98.5 ?F (36.9 ?C) (Oral)   Resp 18   LMP 04/26/2021   SpO2 97%  ? ?Constitutional: Well developed, well nourished, no acute distress ?Eyes:  EOMI, conjunctiva normal bilaterally ?HENT: Normocephalic, atraumatic,mucus membranes moist ?Respiratory: Normal inspiratory  effort ?Cardiovascular: Normal rate ?GI: nondistended ?skin: No rash, skin intact ?Musculoskeletal: L Ankle: Proximal fibula NT, Distal fibula tender, Medial malleolus NT,  Deltoid ligaments medially NT ,  ATFL tender, calcaneofibular ligament tender, posterior tablofibular ligament tender,  Achilles NT, calcaneus NT,  Proximal 5th metatarsal NT, Midfoot NT, distal NVI with baseline sensation / motor to foot with DP 2+. Pain with dorsiflexion/plantar flexion.  Pain with inversion/eversion.  Positive lateral bruising. - squeeze test .  Ant drawer test stable . Pt not able to bear weight in dept.   ?Neurologic: Alert & oriented x 3, no focal neuro deficits ?Psychiatric: Speech and behavior appropriate ? ? ?ED Course ? ? ?Medications  ?acetaminophen (TYLENOL) tablet 650 mg (650 mg Oral Given 05/02/21 0826)  ? ? ?Orders Placed This Encounter  ?Procedures  ? DG Ankle Complete Left  ?  Standing Status:   Standing  ?  Number of Occurrences:   1  ?  Order Specific Question:   Reason for Exam (SYMPTOM  OR DIAGNOSIS REQUIRED)  ?  Answer:   ankle injury  ?  Order Specific Question:   Release to patient  ?  Answer:   Immediate  ? AMB referral to sports medicine  ?  Referral Priority:   Routine  ?  Referral Type:   Consultation  ?  Number of Visits Requested:   1  ? Apply ASO ankle  ?  Standing Status:  Standing  ?  Number of Occurrences:   1  ?  Order Specific Question:   Laterality  ?  Answer:   Left  ? ? ?No results found for this or any previous visit (from the past 24 hour(s)). ?DG Ankle Complete Left ? ?Result Date: 05/02/2021 ?CLINICAL DATA:  Trauma EXAM: LEFT ANKLE COMPLETE - 3+ VIEW COMPARISON:  None. FINDINGS: No recent fracture or dislocation is seen. Minimal bony spurs seen in the talonavicular joint. IMPRESSION: No displaced fracture or dislocation is seen in the left ankle. Minimal bony spurs seen in the dorsal aspect of talonavicular joint. Electronically Signed   By: Ernie Avena M.D.   On: 05/02/2021 08:33    ? ?ED Clinical Impression ? ?1. Sprain of anterior talofibular ligament of left ankle, initial encounter   ?2. Sprain of posterior talofibular ligament of left ankle, initial encounter   ?  ? ?ED Assessment/Plan ? ?Reviewed imaging independently.  No fracture, dislocation.  See radiology report for full details. ? ?Patient with a left ankle sprain of multiple ligaments.  Placing in left ASO, providing crutches per patient request.  Home with Tylenol/ibuprofen, ice, elevation.  Will refer to sports medicine for possible physical therapy and further management.  Discussed with patient that this could take up to 6 to 8 weeks to heal. ? ?Discussed imaging, MDM, treatment plan, and plan for follow-up with patient. patient agrees with plan.  ? ?Meds ordered this encounter  ?Medications  ? acetaminophen (TYLENOL) tablet 650 mg  ? ibuprofen (ADVIL) 600 MG tablet  ?  Sig: Take 1 tablet (600 mg total) by mouth every 6 (six) hours as needed.  ?  Dispense:  30 tablet  ?  Refill:  0  ? ? ? ? ?*This clinic note was created using Scientist, clinical (histocompatibility and immunogenetics). Therefore, there may be occasional mistakes despite careful proofreading. ? ?? ? ?  ?Domenick Gong, MD ?05/02/21 0901 ? ?  ?Domenick Gong, MD ?05/02/21 [phone] ? ?  ?Domenick Gong, MD ?05/02/21 [phone] ? ?

## 2021-05-02 NOTE — Discharge Instructions (Addendum)
Take 600 mg of ibuprofen with 1000 mg of Tylenol 3-4 times a day as needed for pain.  Continue ice and elevation if it helps.  Wear the ASO at all times for the first several weeks , then as needed for comfort.  Please follow-up with [facility].  You may need physical therapy.  This can take up to 6 to 8 weeks to heal. ?

## 2021-05-10 ENCOUNTER — Ambulatory Visit: Payer: Medicaid Other | Admitting: Family Medicine

## 2021-05-10 ENCOUNTER — Encounter: Payer: Self-pay | Admitting: Family Medicine

## 2021-05-10 VITALS — BP 111/76 | Ht 68.0 in | Wt 180.0 lb

## 2021-05-10 DIAGNOSIS — S93492A Sprain of other ligament of left ankle, initial encounter: Secondary | ICD-10-CM | POA: Diagnosis present

## 2021-05-10 NOTE — Patient Instructions (Signed)
You have an ankle sprain on your left foot.   ? ?As we discussed, I want to start you in some rehabilitation exercises.   ? ?For the first week I want you to do the inversion eversion exercises 20 times on each side once or twice a day.  In the second week and want you to continue those but add the double toe raise doing 20 of those, add the balancing on the injured side and add the side step up.   ? ? ?See me back in 2 to 3 weeks.   ? ?Continue to ice once a day if she can at all.  Use a thin cloth in between the ice and your skin and ice for 10 to 15 minutes.  Y ? ?ou can wear the brace that you have as you need to.  I would like to see you out of it by the time you come back.  Please call in the interim if you have any new or worsening symptoms or questions.  Otherwise I will see you back in 2 to 3 weeks.  Great to meet you! ?

## 2021-05-10 NOTE — Assessment & Plan Note (Signed)
Throat is resolving.  Notably this is the second sprain on this foot in the last 2 years.  We discussed rehabilitation exercises and the importance of getting back to full strength.  We will start her on those and see her back in 2 to 3 weeks.  She will weight-bear as tolerated.  Discussed the importance of rehabilitation.  Continue icing as per patient instructions in AVS.  Call in the interim with new or worsening symptoms. ?

## 2021-05-10 NOTE — Progress Notes (Signed)
?  Candace Booker - 36 y.o. female MRN [id number]  Date of birth: [birth year] ? ? ? ?SUBJECTIVE:    ?  ?Chief Complaint:/ HPI:  ?Date of injury left ankle sprain May 02, 2021.  Was seen at the emergency department had x-rays.  Diagnosis ankle sprain.  Has improved symptomatically since then.  Able to bear weight and walk now although it still hurts.  Most of the pain is in the anterior lateral portion of the foot.  Swelling is much better.  This is the second time she sprained this ankle.  She is never done any rehabilitation. ? ?PERTINENT  PMH / PSH: I have reviewed the patient?s medications, allergies, past medical and surgical history, smoking status.  Pertinent findings that relate to today's visit / issues include: ?History of prior left ankle sprain ? ?OBJECTIVE: BP 111/76   Ht 5\' 8"  (1.727 m)   Wt 180 lb (81.6 kg)   LMP 04/26/2021   BMI 27.37 kg/m?   ?Physical Exam:  Vital signs are reviewed. ?GENERAL: Well-developed female no acute distress ?ANKLES: Left has some soft tissue swelling noted over the ATF area.  Left ankle has intact strength and range of motion in dorsiflexion and plantarflexion.  She is limited somewhat by pain in eversion on that side but I think the strength is basically intact.  Inversion strength and range of motion is intact.  The Achilles is without defect.  The calf is soft.  The the medial and lateral malleolus are nontender to palpation, the fifth metatarsal is nontender to palpation. ? ?IMAGING: 3 view left ankle that does not include a mortise view.  I reviewed the images.  I see no sign of fracture.  There is no underlying evidence of arthritis. ? ?ASSESSMENT & PLAN: ? ?See problem based charting & AVS for pt instructions. ?Sprain of anterior talofibular ligament of left ankle ?Throat is resolving.  Notably this is the second sprain on this foot in the last 2 years.  We discussed rehabilitation exercises and the importance of getting back to full strength.  We will start  her on those and see her back in 2 to 3 weeks.  She will weight-bear as tolerated.  Discussed the importance of rehabilitation.  Continue icing as per patient instructions in AVS.  Call in the interim with new or worsening symptoms. ? ?

## 2021-05-24 ENCOUNTER — Ambulatory Visit: Payer: Medicaid Other | Admitting: Family Medicine

## 2021-05-24 VITALS — BP 112/82 | Ht 68.0 in | Wt 180.0 lb

## 2021-05-24 DIAGNOSIS — S93492D Sprain of other ligament of left ankle, subsequent encounter: Secondary | ICD-10-CM | POA: Diagnosis present

## 2021-05-24 NOTE — Progress Notes (Signed)
PCP: Default, Provider, MD ? ?Subjective:  ? ?HPI: ?Patient is a 36 y.o. female here for follow up of left ankle sprain which occurred after fall on 04/17. Patient feels wimproved and reports mild to no pain. She notes that it mostly hurts when she is carrying something down the stairs, but otherwise is not having much problem with walking. No weakness. Has continued to ice ankle some times, but has not needed to take pain medication. She has a brace which she wears 1-3 times per week. She has continued to do home exercises and stretches.    ? ?No Known Allergies ? ?BP 112/82   Ht 5\' 8"  (1.727 m)   Wt 180 lb (81.6 kg)   LMP 04/26/2021   BMI 27.37 kg/m?  ? ? ?  05/10/2021  ? 10:04 AM 05/24/2021  ? 10:04 AM  ?[facility]  ?Frequency of aerobic exercise (# of days/week) 5 5  ?Average time in minutes 30 30  ?Frequency of strengthening activities (# of days/week) 5 5  ? ? ?   ? View : No data to display.  ?  ?  ?  ? ? ?    ?Objective:  ?Physical Exam: ? ?Gen: NAD, comfortable in exam room.  ?MSK: Left ankle --  ?Minimal to no swelling on anterolateral talus. No bruises or deformities. Very mild TTP at the ATF ligament. Full ROM intact. dorsiflexion and eversion strength intact.  is b/l and equal. Negative anterior drawer and talar tilt test. ? ?Neurovascular intact distally.  ? ?  ?Assessment & Plan:  ?Sprain of ATF of left ankle --  ?Patient has marked improvement overall of her left ankle sprain. She is not feeling very limited with her daily activities and is not taking any pain medication. She is occasionally wearing ankle brace, and in the office she was able to do toe and heel raises with minimal tightness at the achilles. We discussed that we expect full recovery in the next couple of weeks. In summary, we discussed the following rehab plan:  ?Can ice as needed and wear ankle brace for comfort as needed.  ?Continue toe raises and stretches. ? ?Please don't hesitate to call if symptoms  worsen or are not recovering. ? ?07/24/2021, MS4 ?[facility]   ?

## 2021-05-25 ENCOUNTER — Encounter: Payer: Self-pay | Admitting: Family Medicine

## 2021-05-25 NOTE — Progress Notes (Signed)
[facility] Attending Note: ?I have seen and examined this patient with the medical student. I have  reviewed the history, physical examination, assessment and plan as documented in the medical student's note.  I agree with the medical student's note and findings, assessment and treatment plan as documented with the following additions or changes: ? ?Ankle sprain resolving. Continue HEP until she can perform 10 single stance heel raises on affected side. Wean out of any brace or wrap. Expect total resolution of sx and full rehab with this plan in next few weeks. RTC if not resolved as discussed or with new or worsening sx. ?

## 2022-06-14 LAB — AMB RESULTS CONSOLE CBG: Glucose: 150

## 2022-06-14 LAB — HEMOGLOBIN A1C: Hemoglobin A1C: 5.5

## 2022-06-16 NOTE — Progress Notes (Unsigned)
Patient states she has PCP but did not remember their name. Patient provided SDOH utility resource at event.

## 2022-07-21 ENCOUNTER — Encounter: Payer: Self-pay | Admitting: *Deleted

## 2022-07-21 NOTE — Progress Notes (Signed)
Pt attended 06/14/2022 screening event where her b/p was 118/84 and her blood sugar was 150 and her A1C was 5.5. At the event pt noted she had utilities insecurities and had a PCP whose name she could not remember at that time. Chart review indicates pt last seen on 05/24/21 by Dr. Denny Levy, who is both a PCP and Sports Medicine physician.  Pt called for follow up to verify whether Dr. Jennette Kettle was her PCP, whom pt confirmed during phone call, although she had no future appts scheduled. (Dr. Jennette Kettle added to her [facility] care team)  Pt also denied any additional need for support with utilities or any other SDOH at this time. No additional health equity team support indicated at this time.

## 2023-08-03 ENCOUNTER — Ambulatory Visit (HOSPITAL_COMMUNITY)
Admission: EM | Admit: 2023-08-03 | Discharge: 2023-08-03 | Disposition: A | Discharge status: Home / Self Care | Attending: Family Medicine | Admitting: Family Medicine

## 2023-08-03 ENCOUNTER — Encounter (HOSPITAL_COMMUNITY): Payer: Self-pay | Admitting: *Deleted

## 2023-08-03 ENCOUNTER — Other Ambulatory Visit: Payer: Self-pay

## 2023-08-03 DIAGNOSIS — J4521 Mild intermittent asthma with (acute) exacerbation: Secondary | ICD-10-CM | POA: Diagnosis not present

## 2023-08-03 DIAGNOSIS — L500 Allergic urticaria: Secondary | ICD-10-CM

## 2023-08-03 MED ORDER — PREDNISONE 20 MG PO TABS: ORAL_TABLET | ORAL | 0 refills | Status: DC

## 2023-08-03 MED ORDER — ALBUTEROL SULFATE HFA 108 (90 BASE) MCG/ACT IN AERS: 2.0000 | INHALATION_SPRAY | RESPIRATORY_TRACT | 0 refills | Status: AC | PRN

## 2023-08-03 MED ORDER — CETIRIZINE HCL 10 MG PO TABS: 10.0000 mg | ORAL_TABLET | Freq: Every day | ORAL | 0 refills | Status: DC | PRN

## 2023-08-03 NOTE — Discharge Instructions (Signed)
 Albuterol  inhaler--do 2 puffs every 4 hours as needed for shortness of breath or wheezing  Take prednisone  20 mg--3 tabs daily x3 days, then 2 tabs daily x3 days, then 1 tab daily x3 days, then one half tab daily x3 days, then stop  Zyrtec /cetirizine  10 mg tablet--take 1 daily as needed for allergy or itching.  You can use the QR code/website at the back of the summary paperwork to schedule yourself a new patient appointment with primary care

## 2023-08-03 NOTE — ED Provider Notes (Signed)
 [facility]    CSN: [id number] Arrival date & time: 08/03/23  0808      History   Chief Complaint Chief Complaint  Patient presents with   Urticaria    HPI Candace Booker is a 38 y.o. female.    Urticaria  Here for urticaria that have been coming and going for 2 or 3 weeks.  They will itch a lot when she has them.  No fever.  She has had some intermittent trouble with her asthma during this time also.  She is out of her inhaler  Last menstrual cycle was July 11  She is allergic to penicillin     Past Medical History:  Diagnosis Date   Asthma    Preterm labor    Scoliosis     Patient Active Problem List   Diagnosis Date Noted   Sprain of anterior talofibular ligament of left ankle 05/10/2021   Encounter for sterilization 09/21/2012   History of pre-term labor 01/23/2011   Vaginal delivery 01/23/2011   Abnormal Pap smear of cervix 01/15/2011    Past Surgical History:  Procedure Laterality Date   LAPAROSCOPIC BILATERAL SALPINGECTOMY Bilateral 09/21/2012   Procedure: LAPAROSCOPIC BILATERAL SALPINGECTOMY;  Surgeon: Elveria Mungo, MD;  Location: [facility];  Service: Gynecology;  Laterality: Bilateral;   NO PAST SURGERIES      OB History     Gravida  4   Para  4   Term  2   Preterm  2   AB  0   Living  4      SAB  0   IAB  0   Ectopic  0   Multiple  0   Live Births  4            Home Medications    Prior to Admission medications   Medication Sig Start Date End Date Taking? Authorizing Provider  albuterol  (VENTOLIN  HFA) 108 (90 Base) MCG/ACT inhaler Inhale 2 puffs into the lungs every 4 (four) hours as needed for wheezing or shortness of breath. 08/03/23  Yes Annabella Elford, Sharlet POUR, MD  cetirizine  (ZYRTEC  ALLERGY) 10 MG tablet Take 1 tablet (10 mg total) by mouth daily as needed for allergies. 08/03/23  Yes Vonna Sharlet POUR, MD  predniSONE  (DELTASONE ) 20 MG tablet 3 tabs daily x3 days, then 2 tabs daily x3 days, then 1  tab daily x3 days, then one half tab daily x3 days, then stop 08/03/23  Yes Jourdon Zimmerle, Sharlet POUR, MD    Family History Family History  Problem Relation Age of Onset   Diabetes Maternal Aunt     Social History Social History   Tobacco Use   Smoking status: Some Days    Current packs/day: 0.25    Types: Cigarettes  Substance Use Topics   Alcohol use: No   Drug use: Not Currently     Allergies   Penicillins   Review of Systems Review of Systems   Physical Exam Triage Vital Signs ED Triage Vitals  Encounter Vitals Group     BP 08/03/23 0831 127/89     Girls Systolic BP Percentile --      Girls Diastolic BP Percentile --      Boys Systolic BP Percentile --      Boys Diastolic BP Percentile --      Pulse Rate 08/03/23 0831 86     Resp 08/03/23 0831 18     Temp 08/03/23 0831 98.4 F (36.9 C)     Temp src --  SpO2 08/03/23 0831 98 %     Weight --      Height --      Head Circumference --      Peak Flow --      Pain Score 08/03/23 0833 0     Pain Loc --      Pain Education --      Exclude from Growth Chart --    No data found.  Updated Vital Signs BP 127/89   Pulse 86   Temp 98.4 F (36.9 C)   Resp 18   LMP 07/24/2023   SpO2 98%   Visual Acuity Right Eye Distance:   Left Eye Distance:   Bilateral Distance:    Right Eye Near:   Left Eye Near:    Bilateral Near:     Physical Exam Vitals reviewed.  Constitutional:      General: She is not in acute distress.    Appearance: She is not toxic-appearing.  HENT:     Mouth/Throat:     Mouth: Mucous membranes are moist.  Eyes:     Extraocular Movements: Extraocular movements intact.     Pupils: Pupils are equal, round, and reactive to light.  Cardiovascular:     Rate and Rhythm: Normal rate and regular rhythm.  Pulmonary:     Effort: Pulmonary effort is normal. No respiratory distress.     Breath sounds: Normal breath sounds. No stridor. No wheezing, rhonchi or rales.  Musculoskeletal:      Cervical back: Neck supple.  Lymphadenopathy:     Cervical: No cervical adenopathy.  Skin:    Coloration: Skin is not pale.     Comments: There is an urticarial rash on her right lateral proximal thigh  Neurological:     General: No focal deficit present.     Mental Status: She is alert and oriented to person, place, and time.  Psychiatric:        Behavior: Behavior normal.      UC Treatments / Results  Labs (all labs ordered are listed, but only abnormal results are displayed) Labs Reviewed - No data to display  EKG   Radiology No results found.  Procedures Procedures (including critical care time)  Medications Ordered in UC Medications - No data to display  Initial Impression / Assessment and Plan / UC Course  I have reviewed the triage vital signs and the nursing notes.  Pertinent labs & imaging results that were available during my care of the patient were reviewed by me and considered in my medical decision making (see chart for details).      Prednisone  is sent in for the urticaria and for asthma exacerbation.  Albuterol  and Zyrtec  are also sent in.  Staff helped her get a primary care appointment   Final Clinical Impressions(s) / UC Diagnoses   Final diagnoses:  Allergic urticaria  Mild intermittent asthma with (acute) exacerbation     Discharge Instructions      Albuterol  inhaler--do 2 puffs every 4 hours as needed for shortness of breath or wheezing  Take prednisone  20 mg--3 tabs daily x3 days, then 2 tabs daily x3 days, then 1 tab daily x3 days, then one half tab daily x3 days, then stop  Zyrtec /cetirizine  10 mg tablet--take 1 daily as needed for allergy or itching.  You can use the QR code/website at the back of the summary paperwork to schedule yourself a new patient appointment with primary care     ED Prescriptions  Medication Sig Dispense Auth. Provider   albuterol  (VENTOLIN  HFA) 108 (90 Base) MCG/ACT inhaler Inhale 2 puffs into  the lungs every 4 (four) hours as needed for wheezing or shortness of breath. 1 each Vonna Sharlet POUR, MD   predniSONE  (DELTASONE ) 20 MG tablet 3 tabs daily x3 days, then 2 tabs daily x3 days, then 1 tab daily x3 days, then one half tab daily x3 days, then stop 20 tablet Lyon Dumont K, MD   cetirizine  (ZYRTEC  ALLERGY) 10 MG tablet Take 1 tablet (10 mg total) by mouth daily as needed for allergies. 30 tablet Camiyah Friberg K, MD      PDMP not reviewed this encounter.   Vonna Sharlet POUR, MD 08/03/23 [phone]

## 2023-08-03 NOTE — ED Triage Notes (Signed)
 PT reports having hives for several weeks . The hives come and go.

## 2023-09-09 ENCOUNTER — Ambulatory Visit: Admitting: Nurse Practitioner

## 2024-01-11 ENCOUNTER — Ambulatory Visit (HOSPITAL_COMMUNITY): Admission: EM | Admit: 2024-01-11 | Discharge: 2024-01-11 | Disposition: A | Discharge status: Home / Self Care

## 2024-01-11 ENCOUNTER — Ambulatory Visit (INDEPENDENT_AMBULATORY_CARE_PROVIDER_SITE_OTHER)

## 2024-01-11 ENCOUNTER — Encounter (HOSPITAL_COMMUNITY): Payer: Self-pay | Admitting: Emergency Medicine

## 2024-01-11 DIAGNOSIS — J4531 Mild persistent asthma with (acute) exacerbation: Secondary | ICD-10-CM

## 2024-01-11 DIAGNOSIS — J069 Acute upper respiratory infection, unspecified: Secondary | ICD-10-CM

## 2024-01-11 MED ORDER — PROMETHAZINE-DM 6.25-15 MG/5ML PO SYRP: 10.0000 mL | ORAL_SOLUTION | Freq: Three times a day (TID) | ORAL | 0 refills | Status: AC | PRN

## 2024-01-11 MED ORDER — AZELASTINE HCL 0.1 % NA SOLN: 1.0000 | Freq: Two times a day (BID) | NASAL | 0 refills | Status: AC

## 2024-01-11 MED ORDER — IPRATROPIUM-ALBUTEROL 0.5-2.5 (3) MG/3ML IN SOLN
3.0000 mL | Freq: Once | RESPIRATORY_TRACT | Status: AC
  Administered 2024-01-11: 3 mL via RESPIRATORY_TRACT

## 2024-01-11 MED ORDER — IPRATROPIUM-ALBUTEROL 0.5-2.5 (3) MG/3ML IN SOLN
RESPIRATORY_TRACT | Status: AC
  Filled 2024-01-11: qty 3

## 2024-01-11 MED ORDER — PREDNISONE 20 MG PO TABS: 40.0000 mg | ORAL_TABLET | Freq: Every day | ORAL | 0 refills | Status: AC

## 2024-01-11 NOTE — Discharge Instructions (Signed)
" °  1. Viral URI with cough (Primary) - azelastine (ASTELIN) 0.1 % nasal spray; Place 1 spray into both nostrils 2 (two) times daily. Use in each nostril as directed  Dispense: 30 mL; Refill: 0 - promethazine -dextromethorphan (PROMETHAZINE -DM) 6.25-15 MG/5ML syrup; Take 10 mLs by mouth 3 (three) times daily as needed for cough.  Dispense: 240 mL; Refill: 0  2. Mild persistent asthma with acute exacerbation - ipratropium-albuterol  (DUONEB) 0.5-2.5 (3) MG/3ML nebulizer solution 3 mL performed in UC for acute wheezing and cough. - predniSONE  (DELTASONE ) 20 MG tablet; Take 2 tablets (40 mg total) by mouth daily for 5 days.  Dispense: 10 tablet; Refill: 0 - DG Chest 2 View x-ray performed in UC shows no acute cardiopulmonary processes, no sign of consolidation or pneumonia.  Normal chest x-ray.  Final radiologist read still pending if there is any abnormality noted by radiologist appropriate treatment will be provided.  -Continue to monitor symptoms for any change in severity if there is any escalation of current symptoms or development of new symptoms follow-up in ER for further evaluation and management. "

## 2024-01-11 NOTE — ED Provider Notes (Signed)
 " [facility]  Note:  This document was prepared using Dragon voice recognition software and may include unintentional dictation errors.  MRN: [id number] DOB: [birth year]  Subjective:   Candace Booker is a 38 y.o. female presenting for cough, nasal congestion, body aches, fatigue, chest congestion with wheezing x 4 to 5 days.  Patient reports that she has history of asthma and believes that her asthma is flared up, has been using albuterol  with minimal improvement.  Patient denies any chest pain, weakness, dizziness, fever but is having some intermittent shortness of breath with coughing.  Patient denies any known sick contacts.  Current Medications[1]   Allergies[2]  Past Medical History:  Diagnosis Date   Asthma    Preterm labor    Scoliosis      Past Surgical History:  Procedure Laterality Date   LAPAROSCOPIC BILATERAL SALPINGECTOMY Bilateral 09/21/2012   Procedure: LAPAROSCOPIC BILATERAL SALPINGECTOMY;  Surgeon: Elveria Mungo, MD;  Location: [facility];  Service: Gynecology;  Laterality: Bilateral;   NO PAST SURGERIES      Family History  Problem Relation Age of Onset   Diabetes Maternal Aunt     Social History[3]  ROS Refer to HPI for ROS details.  Objective:    Vitals: BP 121/87 (BP Location: Left Arm)   Pulse 91   Temp 97.8 F (36.6 C)   Resp 18   LMP 01/01/2024 (Exact Date)   SpO2 96%   Physical Exam Vitals and nursing note reviewed.  Constitutional:      General: She is not in acute distress.    Appearance: Normal appearance. She is well-developed. She is not ill-appearing or toxic-appearing.  HENT:     Head: Normocephalic and atraumatic.     Nose: Congestion present.     Mouth/Throat:     Mouth: Mucous membranes are moist.     Pharynx: Oropharynx is clear.  Cardiovascular:     Rate and Rhythm: Normal rate and regular rhythm.     Heart sounds: Normal heart sounds.  Pulmonary:     Effort: Pulmonary effort is normal. No  respiratory distress.     Breath sounds: No stridor. Wheezing present. No rhonchi or rales.  Chest:     Chest wall: No tenderness.  Skin:    General: Skin is warm and dry.  Neurological:     General: No focal deficit present.     Mental Status: She is alert and oriented to person, place, and time.  Psychiatric:        Mood and Affect: Mood normal.        Behavior: Behavior normal.     Procedures  No results found for this or any previous visit (from the past 24 hours).  Assessment and Plan :     Discharge Instructions       1. Viral URI with cough (Primary) - azelastine (ASTELIN) 0.1 % nasal spray; Place 1 spray into both nostrils 2 (two) times daily. Use in each nostril as directed  Dispense: 30 mL; Refill: 0 - promethazine -dextromethorphan (PROMETHAZINE -DM) 6.25-15 MG/5ML syrup; Take 10 mLs by mouth 3 (three) times daily as needed for cough.  Dispense: 240 mL; Refill: 0  2. Mild persistent asthma with acute exacerbation - ipratropium-albuterol  (DUONEB) 0.5-2.5 (3) MG/3ML nebulizer solution 3 mL performed in UC for acute wheezing and cough. - predniSONE  (DELTASONE ) 20 MG tablet; Take 2 tablets (40 mg total) by mouth daily for 5 days.  Dispense: 10 tablet; Refill: 0 - DG Chest 2 View x-ray performed  in UC shows no acute cardiopulmonary processes, no sign of consolidation or pneumonia.  Normal chest x-ray.  Final radiologist read still pending if there is any abnormality noted by radiologist appropriate treatment will be provided.  -Continue to monitor symptoms for any change in severity if there is any escalation of current symptoms or development of new symptoms follow-up in ER for further evaluation and management.      Oluwatimileyin Vivier B Paysley Poplar    [1] No current facility-administered medications for this encounter.  Current Outpatient Medications:    azelastine (ASTELIN) 0.1 % nasal spray, Place 1 spray into both nostrils 2 (two) times daily. Use in each nostril as  directed, Disp: 30 mL, Rfl: 0   predniSONE  (DELTASONE ) 20 MG tablet, Take 2 tablets (40 mg total) by mouth daily for 5 days., Disp: 10 tablet, Rfl: 0   promethazine -dextromethorphan (PROMETHAZINE -DM) 6.25-15 MG/5ML syrup, Take 10 mLs by mouth 3 (three) times daily as needed for cough., Disp: 240 mL, Rfl: 0   albuterol  (VENTOLIN  HFA) 108 (90 Base) MCG/ACT inhaler, Inhale 2 puffs into the lungs every 4 (four) hours as needed for wheezing or shortness of breath., Disp: 1 each, Rfl: 0 [2]  Allergies Allergen Reactions   Penicillins Anaphylaxis  [3]  Social History Tobacco Use   Smoking status: Some Days    Current packs/day: 0.25    Types: Cigarettes  Substance Use Topics   Alcohol use: No   Drug use: Not Currently     Aurea Ethel NOVAK, NP 01/11/24 1124  "

## 2024-01-11 NOTE — ED Triage Notes (Signed)
 Since Thursday having cough, congestion, body aches, fatigue. Reports that asthma flaring up causing hard to breathe. Reports had decreased appetite.

## 2024-01-13 ENCOUNTER — Ambulatory Visit (HOSPITAL_COMMUNITY): Payer: Self-pay

## 2024-01-15 MED ORDER — AZITHROMYCIN 250 MG PO TABS: ORAL_TABLET | ORAL | 0 refills | Status: AC
